# Patient Record
Sex: Male | Born: 1995 | Race: Black or African American | Hispanic: No | Marital: Married | Smoking: Current every day smoker
Health system: Southern US, Community
[De-identification: ages and names within clinical notes are randomized; demographics above are authoritative.]

## PROBLEM LIST (undated history)

## (undated) DIAGNOSIS — S6990XA Unspecified injury of unspecified wrist, hand and finger(s), initial encounter: Secondary | ICD-10-CM

## (undated) DIAGNOSIS — R413 Other amnesia: Principal | ICD-10-CM

## (undated) DIAGNOSIS — G4710 Hypersomnia, unspecified: Secondary | ICD-10-CM

## (undated) DIAGNOSIS — L708 Other acne: Secondary | ICD-10-CM

## (undated) DIAGNOSIS — E669 Obesity, unspecified: Secondary | ICD-10-CM

## (undated) DIAGNOSIS — S59919A Unspecified injury of unspecified forearm, initial encounter: Secondary | ICD-10-CM

## (undated) DIAGNOSIS — M25569 Pain in unspecified knee: Secondary | ICD-10-CM

## (undated) DIAGNOSIS — S59909A Unspecified injury of unspecified elbow, initial encounter: Secondary | ICD-10-CM

## (undated) DIAGNOSIS — M25579 Pain in unspecified ankle and joints of unspecified foot: Secondary | ICD-10-CM

## (undated) HISTORY — PX: MENISCUS REPAIR: SHX5179

## (undated) HISTORY — DX: Unspecified injury of unspecified wrist, hand and finger(s), initial encounter: S59.909A

## (undated) HISTORY — DX: Hypersomnia, unspecified: G47.10

## (undated) HISTORY — PX: HERNIA REPAIR: SHX51

## (undated) HISTORY — DX: Unspecified injury of unspecified elbow, initial encounter: S59.919A

## (undated) HISTORY — DX: Pain in unspecified ankle and joints of unspecified foot: M25.579

## (undated) HISTORY — DX: Other acne: L70.8

## (undated) HISTORY — DX: Obesity, unspecified: E66.9

## (undated) HISTORY — DX: Other amnesia: R41.3

## (undated) HISTORY — DX: Unspecified injury of unspecified wrist, hand and finger(s), initial encounter: S69.90XA

## (undated) HISTORY — DX: Pain in unspecified knee: M25.569

---
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
## 2014-01-13 ENCOUNTER — Ambulatory Visit: Payer: Medicaid Other | Admitting: Nurse Practitioner

## 2014-01-27 ENCOUNTER — Telehealth: Payer: Self-pay | Admitting: Nurse Practitioner

## 2014-01-27 ENCOUNTER — Ambulatory Visit: Payer: Medicaid Other | Admitting: Nurse Practitioner

## 2014-01-27 NOTE — Telephone Encounter (Signed)
Patient was no show for office appointment today. 

## 2014-02-03 ENCOUNTER — Ambulatory Visit (INDEPENDENT_AMBULATORY_CARE_PROVIDER_SITE_OTHER): Payer: Medicaid Other | Admitting: Nurse Practitioner

## 2014-02-03 ENCOUNTER — Encounter: Payer: Self-pay | Admitting: Nurse Practitioner

## 2014-02-03 VITALS — BP 113/72 | HR 76 | Ht 72.0 in | Wt 266.0 lb

## 2014-02-03 DIAGNOSIS — R413 Other amnesia: Secondary | ICD-10-CM

## 2014-02-03 DIAGNOSIS — R51 Headache: Secondary | ICD-10-CM

## 2014-02-03 DIAGNOSIS — M25539 Pain in unspecified wrist: Secondary | ICD-10-CM

## 2014-02-03 DIAGNOSIS — M25531 Pain in right wrist: Secondary | ICD-10-CM

## 2014-02-03 DIAGNOSIS — F341 Dysthymic disorder: Secondary | ICD-10-CM

## 2014-02-03 DIAGNOSIS — R519 Headache, unspecified: Secondary | ICD-10-CM

## 2014-02-03 NOTE — Patient Instructions (Addendum)
We will check your labwork today, to check on possible causes for memory problems.  Memory problems often come from symptoms of depression as well.  Try to get regular exercise and eat healthy foods.  If you continue to have wrist pain, please follow up with the orthopedic doctor to investigate possible causes or treatments.  Follow up in our office as needed.
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
## 2014-02-04 NOTE — Progress Notes (Signed)
I have read the note, and I agree with the clinical assessment and plan.  WILLIS,CHARLES KEITH   

## 2014-02-05 LAB — RPR: RPR: NONREACTIVE

## 2014-02-05 LAB — VITAMIN B12: Vitamin B-12: 523 pg/mL (ref 211–946)

## 2014-02-05 LAB — HEMOGLOBIN A1C
Est. average glucose Bld gHb Est-mCnc: 100 mg/dL
Hgb A1c MFr Bld: 5.1 % (ref 4.8–5.6)

## 2014-02-05 LAB — TSH: TSH: 1.49 u[IU]/mL (ref 0.450–4.500)

## 2014-02-05 LAB — HIV ANTIBODY (ROUTINE TESTING W REFLEX)
HIV 1/O/2 Abs-Index Value: <1 (ref <1.00)
HIV-1/HIV-2 Ab: NONREACTIVE

## 2014-02-07 NOTE — Progress Notes (Signed)
Quick Note:  Shared normal labs with patient, he verbalized understanding ______ 

## 2017-02-03 ENCOUNTER — Emergency Department (HOSPITAL_COMMUNITY)
Admission: EM | Admit: 2017-02-03 | Discharge: 2017-02-04 | Disposition: A | Discharge status: Home / Self Care | Payer: Self-pay | Attending: Emergency Medicine | Admitting: Emergency Medicine

## 2017-02-03 ENCOUNTER — Encounter (HOSPITAL_COMMUNITY): Payer: Self-pay | Admitting: Emergency Medicine

## 2017-02-03 ENCOUNTER — Emergency Department (HOSPITAL_COMMUNITY): Payer: Self-pay

## 2017-02-03 DIAGNOSIS — F17200 Nicotine dependence, unspecified, uncomplicated: Secondary | ICD-10-CM | POA: Insufficient documentation

## 2017-02-03 DIAGNOSIS — Z23 Encounter for immunization: Secondary | ICD-10-CM | POA: Insufficient documentation

## 2017-02-03 DIAGNOSIS — L0291 Cutaneous abscess, unspecified: Secondary | ICD-10-CM

## 2017-02-03 DIAGNOSIS — Z79899 Other long term (current) drug therapy: Secondary | ICD-10-CM | POA: Insufficient documentation

## 2017-02-03 DIAGNOSIS — L02215 Cutaneous abscess of perineum: Secondary | ICD-10-CM | POA: Insufficient documentation

## 2017-02-03 LAB — CBC WITH DIFFERENTIAL/PLATELET
Basophils Absolute: 0 10*3/uL (ref 0.0–0.1)
Basophils Relative: 0 %
Eosinophils Absolute: 0.2 10*3/uL (ref 0.0–0.7)
Eosinophils Relative: 2 %
HCT: 45.8 % (ref 39.0–52.0)
Hemoglobin: 15.4 g/dL (ref 13.0–17.0)
Lymphocytes Relative: 19 %
Lymphs Abs: 2.3 10*3/uL (ref 0.7–4.0)
MCH: 29.1 pg (ref 26.0–34.0)
MCHC: 33.6 g/dL (ref 30.0–36.0)
MCV: 86.6 fL (ref 78.0–100.0)
Monocytes Absolute: 1 10*3/uL (ref 0.1–1.0)
Monocytes Relative: 8 %
Neutro Abs: 8.6 10*3/uL — ABNORMAL HIGH (ref 1.7–7.7)
Neutrophils Relative %: 71 %
Platelets: 255 10*3/uL (ref 150–400)
RBC: 5.29 MIL/uL (ref 4.22–5.81)
RDW: 13.6 % (ref 11.5–15.5)
WBC: 12.2 10*3/uL — ABNORMAL HIGH (ref 4.0–10.5)

## 2017-02-03 LAB — BASIC METABOLIC PANEL
Anion gap: 7 (ref 5–15)
BUN: 8 mg/dL (ref 6–20)
CO2: 27 mmol/L (ref 22–32)
Calcium: 9.8 mg/dL (ref 8.9–10.3)
Chloride: 100 mmol/L — ABNORMAL LOW (ref 101–111)
Creatinine, Ser: 1.3 mg/dL — ABNORMAL HIGH (ref 0.61–1.24)
GFR calc Af Amer: >60 mL/min (ref >60)
GFR calc non Af Amer: >60 mL/min (ref >60)
Glucose, Bld: 91 mg/dL (ref 65–99)
Potassium: 4.5 mmol/L (ref 3.5–5.1)
Sodium: 134 mmol/L — ABNORMAL LOW (ref 135–145)

## 2017-02-03 LAB — URINALYSIS, ROUTINE W REFLEX MICROSCOPIC
Bacteria, UA: NONE SEEN
Bilirubin Urine: NEGATIVE
Glucose, UA: NEGATIVE mg/dL
Ketones, ur: NEGATIVE mg/dL
Leukocytes, UA: NEGATIVE
Nitrite: NEGATIVE
Protein, ur: NEGATIVE mg/dL
Specific Gravity, Urine: 1.014 (ref 1.005–1.030)
pH: 6 (ref 5.0–8.0)

## 2017-02-03 MED ORDER — IOPAMIDOL (ISOVUE-300) INJECTION 61%
INTRAVENOUS | Status: AC
  Filled 2017-02-03: qty 100

## 2017-02-03 MED ORDER — SODIUM CHLORIDE 0.9 % IV BOLUS (SEPSIS)
1000.0000 mL | Freq: Once | INTRAVENOUS | Status: AC
Start: 2017-02-03 — End: 2017-02-03
  Administered 2017-02-03: 1000 mL via INTRAVENOUS

## 2017-02-03 MED ORDER — IOPAMIDOL (ISOVUE-300) INJECTION 61%
100.0000 mL | Freq: Once | INTRAVENOUS | Status: AC | PRN
  Administered 2017-02-03: 100 mL via INTRAVENOUS

## 2017-02-03 MED ORDER — MORPHINE SULFATE (PF) 4 MG/ML IV SOLN
4.0000 mg | Freq: Once | INTRAVENOUS | Status: AC
  Administered 2017-02-03: 4 mg via INTRAVENOUS
  Filled 2017-02-03: qty 1

## 2017-02-03 MED ORDER — AMOXICILLIN-POT CLAVULANATE 875-125 MG PO TABS
1.0000 | ORAL_TABLET | Freq: Once | ORAL | Status: AC
  Administered 2017-02-04: 1 via ORAL
  Filled 2017-02-03: qty 1

## 2017-02-03 MED ORDER — ONDANSETRON HCL 4 MG/2ML IJ SOLN
4.0000 mg | Freq: Once | INTRAMUSCULAR | Status: AC
  Administered 2017-02-03: 4 mg via INTRAVENOUS
  Filled 2017-02-03: qty 2

## 2017-02-03 MED ORDER — TETANUS-DIPHTH-ACELL PERTUSSIS 5-2.5-18.5 LF-MCG/0.5 IM SUSP
0.5000 mL | Freq: Once | INTRAMUSCULAR | Status: AC
  Administered 2017-02-04: 0.5 mL via INTRAMUSCULAR
  Filled 2017-02-03: qty 0.5
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
## 2017-02-03 NOTE — ED Notes (Signed)
Pt has hx of abscesses.

## 2017-02-03 NOTE — ED Triage Notes (Signed)
Pt from home with complaints of abscess on his testicle. Pt states it has been present for about 3 days. Pt denies fever, chills, and denies drainage.

## 2017-02-04 MED ORDER — HYDROCODONE-ACETAMINOPHEN 5-325 MG PO TABS: 1.0000 | ORAL_TABLET | Freq: Four times a day (QID) | ORAL | 0 refills | Status: DC | PRN

## 2017-02-04 MED ORDER — HYDROCODONE-ACETAMINOPHEN 5-325 MG PO TABS
2.0000 | ORAL_TABLET | Freq: Once | ORAL | Status: AC
  Administered 2017-02-04: 2 via ORAL
  Filled 2017-02-04: qty 2

## 2017-02-04 MED ORDER — AMOXICILLIN-POT CLAVULANATE 875-125 MG PO TABS: 1.0000 | ORAL_TABLET | Freq: Two times a day (BID) | ORAL | 0 refills | Status: DC

## 2017-02-04 MED ORDER — IBUPROFEN 600 MG PO TABS: 600.0000 mg | ORAL_TABLET | Freq: Four times a day (QID) | ORAL | 0 refills | Status: DC | PRN

## 2017-02-04 MED ORDER — LIDOCAINE-EPINEPHRINE-TETRACAINE (LET) SOLUTION
3.0000 mL | Freq: Once | NASAL | Status: AC
  Administered 2017-02-04: 3 mL via TOPICAL
  Filled 2017-02-04: qty 3

## 2017-02-04 NOTE — Discharge Instructions (Signed)
Remove the bandage after 24 hours. You must wait at least 8 hours after the wound repair to wash the wound. Clean the wound and surrounding area gently with tap water and mild soap. Rinse well and blot dry. You may shower, but avoid submerging the wound, such as with a bath or swimming. Clean the wound daily to prevent infection. Do not use cleaners such as hydrogen peroxide or alcohol. You may use Tylenol, naproxen, or ibuprofen for pain. Vicodin for severe pain. Do not drive or perform other dangerous activities while taking the Vicodin.  Please take all of your antibiotics until finished!   You may develop abdominal discomfort or diarrhea from the antibiotic.  You may help offset this with probiotics which you can buy or get in yogurt. Do not eat or take the probiotics until 2 hours after your antibiotic.   Follow-up with a primary care provider or the urology specialists as soon as possible within the next week for wound check. For follow-up with the urology specialists, call the number provided to set up an appointment.  Return to the ED should you have a severe increase in pain, began to have a fever, the abscess or swelling recurs, or you have any other concerns.

## 2017-02-19 ENCOUNTER — Encounter (HOSPITAL_COMMUNITY): Payer: Self-pay | Admitting: Emergency Medicine

## 2017-02-19 ENCOUNTER — Emergency Department (HOSPITAL_COMMUNITY)
Admission: EM | Admit: 2017-02-19 | Discharge: 2017-02-19 | Disposition: A | Discharge status: Home / Self Care | Payer: Medicaid Other | Attending: Emergency Medicine | Admitting: Emergency Medicine

## 2017-02-19 DIAGNOSIS — L02416 Cutaneous abscess of left lower limb: Secondary | ICD-10-CM | POA: Insufficient documentation

## 2017-02-19 DIAGNOSIS — F17200 Nicotine dependence, unspecified, uncomplicated: Secondary | ICD-10-CM | POA: Insufficient documentation

## 2017-02-19 MED ORDER — DOXYCYCLINE HYCLATE 100 MG PO CAPS: 100.0000 mg | ORAL_CAPSULE | Freq: Two times a day (BID) | ORAL | 0 refills | Status: DC

## 2017-02-19 NOTE — Discharge Instructions (Signed)
Continue to apply warm moist compress to affected area several times daily to help with healing.  Take antibiotic as prescribed.  Return to the ER if you notice increasing pain and swelling despite treatment.
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
## 2017-02-19 NOTE — ED Triage Notes (Signed)
Pt states he developed an abscess on his left groin 2 days ago.

## 2017-09-15 ENCOUNTER — Emergency Department (HOSPITAL_COMMUNITY)
Admission: EM | Admit: 2017-09-15 | Discharge: 2017-09-15 | Disposition: A | Discharge status: Home / Self Care | Payer: Self-pay | Attending: Emergency Medicine | Admitting: Emergency Medicine

## 2017-09-15 ENCOUNTER — Encounter (HOSPITAL_COMMUNITY): Payer: Self-pay

## 2017-09-15 DIAGNOSIS — J029 Acute pharyngitis, unspecified: Secondary | ICD-10-CM | POA: Insufficient documentation

## 2017-09-15 DIAGNOSIS — R05 Cough: Secondary | ICD-10-CM | POA: Insufficient documentation

## 2017-09-15 DIAGNOSIS — F17200 Nicotine dependence, unspecified, uncomplicated: Secondary | ICD-10-CM | POA: Insufficient documentation

## 2017-09-15 LAB — RAPID STREP SCREEN (MED CTR MEBANE ONLY): Streptococcus, Group A Screen (Direct): NEGATIVE
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
## 2017-09-15 NOTE — Discharge Instructions (Signed)
Alternate between Tylenol and ibuprofen as needed for pain. Gargle warm salt water and spit it out. It is very important to stay hydrated!  Follow up with your primary care doctor in 5-7 days for recheck of ongoing symptoms and return to emergency department if any new or worsening of symptoms develop or you have any additional concerns.  °

## 2017-09-18 LAB — CULTURE, GROUP A STREP (THRC)

## 2017-10-29 ENCOUNTER — Emergency Department (HOSPITAL_COMMUNITY)
Admission: EM | Admit: 2017-10-29 | Discharge: 2017-10-29 | Disposition: A | Discharge status: Home / Self Care | Payer: Self-pay | Attending: Emergency Medicine | Admitting: Emergency Medicine

## 2017-10-29 ENCOUNTER — Other Ambulatory Visit: Payer: Self-pay

## 2017-10-29 ENCOUNTER — Encounter (HOSPITAL_COMMUNITY): Payer: Self-pay | Admitting: Emergency Medicine

## 2017-10-29 DIAGNOSIS — L0231 Cutaneous abscess of buttock: Secondary | ICD-10-CM | POA: Insufficient documentation

## 2017-10-29 DIAGNOSIS — F17210 Nicotine dependence, cigarettes, uncomplicated: Secondary | ICD-10-CM | POA: Insufficient documentation

## 2017-10-29 MED ORDER — OXYCODONE-ACETAMINOPHEN 5-325 MG PO TABS
1.0000 | ORAL_TABLET | Freq: Once | ORAL | Status: AC
  Administered 2017-10-29: 1 via ORAL
  Filled 2017-10-29: qty 1

## 2017-10-29 MED ORDER — ACETAMINOPHEN 325 MG PO TABS
650.0000 mg | ORAL_TABLET | Freq: Once | ORAL | Status: AC
  Administered 2017-10-29: 650 mg via ORAL
  Filled 2017-10-29: qty 2

## 2017-10-29 MED ORDER — LIDOCAINE-EPINEPHRINE (PF) 2 %-1:200000 IJ SOLN
20.0000 mL | Freq: Once | INTRAMUSCULAR | Status: AC
  Administered 2017-10-29: 20 mL
  Filled 2017-10-29: qty 20

## 2017-10-29 MED ORDER — DOXYCYCLINE HYCLATE 100 MG PO CAPS: 100.0000 mg | ORAL_CAPSULE | Freq: Two times a day (BID) | ORAL | 0 refills | Status: AC

## 2017-10-29 NOTE — Discharge Instructions (Signed)
Your abscess was drained today. Take antibiotics as prescribed. Continue doing warm compresses or use heating pad to the area. For pain take Tylenol and ibuprofen every 6-8 hours. You have packing in the abscess, this needs to be removed in the next 2-3 days. You should follow-up with a provider whether it's a primary care provider, urgent care or emergency Department for wound check and packing removal in 2-3 days. Return to the ED sooner if your symptoms worsen, you develop fever, chills, increased swelling, increased pain, pain with bowel movements.

## 2017-10-29 NOTE — ED Triage Notes (Signed)
Pt has abscess on his right butt cheek.  Has had it for about a week.  No other symptoms.
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
## 2017-12-18 ENCOUNTER — Other Ambulatory Visit: Payer: Self-pay

## 2017-12-18 ENCOUNTER — Emergency Department (HOSPITAL_COMMUNITY)
Admission: EM | Admit: 2017-12-18 | Discharge: 2017-12-18 | Disposition: A | Discharge status: Home / Self Care | Payer: Self-pay | Attending: Emergency Medicine | Admitting: Emergency Medicine

## 2017-12-18 ENCOUNTER — Encounter (HOSPITAL_COMMUNITY): Payer: Self-pay | Admitting: *Deleted

## 2017-12-18 DIAGNOSIS — F17200 Nicotine dependence, unspecified, uncomplicated: Secondary | ICD-10-CM | POA: Insufficient documentation

## 2017-12-18 DIAGNOSIS — Z23 Encounter for immunization: Secondary | ICD-10-CM | POA: Insufficient documentation

## 2017-12-18 DIAGNOSIS — L0291 Cutaneous abscess, unspecified: Secondary | ICD-10-CM

## 2017-12-18 DIAGNOSIS — L02811 Cutaneous abscess of head [any part, except face]: Secondary | ICD-10-CM | POA: Insufficient documentation

## 2017-12-18 MED ORDER — LIDOCAINE HCL (PF) 1 % IJ SOLN
5.0000 mL | Freq: Once | INTRAMUSCULAR | Status: AC
  Administered 2017-12-18: 5 mL
  Filled 2017-12-18: qty 5

## 2017-12-18 MED ORDER — NAPROXEN 500 MG PO TABS: 500.0000 mg | ORAL_TABLET | Freq: Two times a day (BID) | ORAL | 0 refills | Status: DC

## 2017-12-18 MED ORDER — TETANUS-DIPHTH-ACELL PERTUSSIS 5-2.5-18.5 LF-MCG/0.5 IM SUSP
0.5000 mL | Freq: Once | INTRAMUSCULAR | Status: AC
  Administered 2017-12-18: 0.5 mL via INTRAMUSCULAR
  Filled 2017-12-18: qty 0.5

## 2017-12-18 MED ORDER — ACETAMINOPHEN 325 MG PO TABS
650.0000 mg | ORAL_TABLET | Freq: Once | ORAL | Status: AC
  Administered 2017-12-18: 650 mg via ORAL
  Filled 2017-12-18: qty 2

## 2017-12-18 NOTE — ED Triage Notes (Signed)
Pt reports having a bump on right side of his head for several days, has been causing headaches. The bump began draining last night. Denies fever. No acute distress is noted at triage.

## 2017-12-18 NOTE — Discharge Instructions (Signed)
You were seen in the emergency department today for an abscess.  The abscess was opened and drained.  Please see the attached handout regarding aftercare instructions.  You will need to have this area rechecked in 2-3 days, you may return to the emergency department, go to an urgent care, or see a primary care provider.  You were given a prescription for naproxen to help with pain. Naproxen is a nonsteroidal anti-inflammatory medication that will help with pain and swelling. Be sure to take this medication as prescribed with food, 1 pill every 12 hours,  It should be taken with food, as it can cause stomach upset, and more seriously, stomach bleeding. Do not take other nonsteroidal anti-inflammatory medications with this such as Advil, Motrin, or Aleve.     Return to the emergency department sooner for any worsening symptoms including but not limited to fevers, vomiting, or any other concerns.
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
## 2018-02-25 ENCOUNTER — Other Ambulatory Visit: Payer: Self-pay

## 2018-02-25 ENCOUNTER — Emergency Department (HOSPITAL_COMMUNITY): Payer: Self-pay

## 2018-02-25 ENCOUNTER — Encounter (HOSPITAL_COMMUNITY): Payer: Self-pay | Admitting: Emergency Medicine

## 2018-02-25 ENCOUNTER — Emergency Department (HOSPITAL_COMMUNITY)
Admission: EM | Admit: 2018-02-25 | Discharge: 2018-02-25 | Disposition: A | Discharge status: Home / Self Care | Payer: Self-pay | Attending: Emergency Medicine | Admitting: Emergency Medicine

## 2018-02-25 DIAGNOSIS — Z79899 Other long term (current) drug therapy: Secondary | ICD-10-CM | POA: Insufficient documentation

## 2018-02-25 DIAGNOSIS — R1110 Vomiting, unspecified: Secondary | ICD-10-CM | POA: Insufficient documentation

## 2018-02-25 DIAGNOSIS — J029 Acute pharyngitis, unspecified: Secondary | ICD-10-CM | POA: Insufficient documentation

## 2018-02-25 DIAGNOSIS — R062 Wheezing: Secondary | ICD-10-CM | POA: Insufficient documentation

## 2018-02-25 DIAGNOSIS — F17210 Nicotine dependence, cigarettes, uncomplicated: Secondary | ICD-10-CM | POA: Insufficient documentation

## 2018-02-25 DIAGNOSIS — J209 Acute bronchitis, unspecified: Secondary | ICD-10-CM | POA: Insufficient documentation

## 2018-02-25 DIAGNOSIS — J9801 Acute bronchospasm: Secondary | ICD-10-CM

## 2018-02-25 MED ORDER — ALBUTEROL SULFATE HFA 108 (90 BASE) MCG/ACT IN AERS: 1.0000 | INHALATION_SPRAY | Freq: Four times a day (QID) | RESPIRATORY_TRACT | 0 refills | Status: DC | PRN

## 2018-02-25 MED ORDER — PREDNISONE 20 MG PO TABS
60.0000 mg | ORAL_TABLET | Freq: Once | ORAL | Status: AC
  Administered 2018-02-25: 60 mg via ORAL
  Filled 2018-02-25: qty 3

## 2018-02-25 MED ORDER — PROMETHAZINE-DM 6.25-15 MG/5ML PO SYRP: 5.0000 mL | ORAL_SOLUTION | Freq: Four times a day (QID) | ORAL | 0 refills | Status: DC | PRN

## 2018-02-25 MED ORDER — PREDNISONE 20 MG PO TABS: 20.0000 mg | ORAL_TABLET | Freq: Two times a day (BID) | ORAL | 0 refills | Status: DC

## 2018-02-25 MED ORDER — PROMETHAZINE-CODEINE 6.25-10 MG/5ML PO SYRP
5.0000 mL | ORAL_SOLUTION | Freq: Four times a day (QID) | ORAL | Status: DC | PRN
  Administered 2018-02-25: 5 mL via ORAL
  Filled 2018-02-25: qty 5

## 2018-02-25 MED ORDER — ALBUTEROL SULFATE (2.5 MG/3ML) 0.083% IN NEBU
2.5000 mg | INHALATION_SOLUTION | RESPIRATORY_TRACT | Status: DC | PRN
  Administered 2018-02-25: 2.5 mg via RESPIRATORY_TRACT
  Filled 2018-02-25: qty 3

## 2018-02-25 MED ORDER — BENZONATATE 100 MG PO CAPS: 100.0000 mg | ORAL_CAPSULE | Freq: Three times a day (TID) | ORAL | 0 refills | Status: DC

## 2018-02-25 MED ORDER — ONDANSETRON 4 MG PO TBDP
4.0000 mg | ORAL_TABLET | Freq: Once | ORAL | Status: AC
  Administered 2018-02-25: 4 mg via ORAL
  Filled 2018-02-25: qty 1

## 2018-02-25 NOTE — ED Triage Notes (Signed)
Pt complaint of cough for a few days; headache only with cough. Pt verbalizes emesis episode this morning and "maybe some blood in it."
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
## 2018-02-25 NOTE — ED Notes (Signed)
Patient transported to X-ray 

## 2018-02-25 NOTE — Discharge Instructions (Addendum)
Inhaler every 4 hours as needed.  Take prednisone with food to avoid stomach upset.  Tessalon for daytime cough.  Phenergan DM for nighttime cough.  Causes drowsiness.

## 2018-06-26 ENCOUNTER — Other Ambulatory Visit: Payer: Self-pay

## 2018-06-26 ENCOUNTER — Emergency Department (HOSPITAL_COMMUNITY)
Admission: EM | Admit: 2018-06-26 | Discharge: 2018-06-26 | Disposition: A | Discharge status: Home / Self Care | Payer: Self-pay | Attending: Emergency Medicine | Admitting: Emergency Medicine

## 2018-06-26 ENCOUNTER — Encounter (HOSPITAL_COMMUNITY): Payer: Self-pay

## 2018-06-26 DIAGNOSIS — L02416 Cutaneous abscess of left lower limb: Secondary | ICD-10-CM | POA: Insufficient documentation

## 2018-06-26 DIAGNOSIS — F17210 Nicotine dependence, cigarettes, uncomplicated: Secondary | ICD-10-CM | POA: Insufficient documentation

## 2018-06-26 MED ORDER — LIDOCAINE-EPINEPHRINE (PF) 2 %-1:200000 IJ SOLN
20.0000 mL | Freq: Once | INTRAMUSCULAR | Status: AC
  Administered 2018-06-26: 20 mL via INTRADERMAL
  Filled 2018-06-26: qty 20

## 2018-06-26 MED ORDER — DOXYCYCLINE HYCLATE 100 MG PO CAPS: 100.0000 mg | ORAL_CAPSULE | Freq: Two times a day (BID) | ORAL | 0 refills | Status: DC

## 2018-06-26 NOTE — ED Notes (Addendum)
Pt left ER without d/c paperwork or RN discharging pt. Pt left without antibiotic prescription. RN Waldo LaineKaitlin and RN Larita FifeMadina looked for pt without finding him. RN attempted to call pt on number listed.  Pt did not answer when RN attempted to call
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
## 2018-06-26 NOTE — Discharge Instructions (Signed)
You had an abscess which was drained in the ER today.  Please gently wash with warm soapy water starting tomorrow and keep covered with daily dressing changes.  Take antibiotic twice a day for the next ten days until all antibiotic is completely gone.  It will continue to ooze and leak for several days, this is normal.  You can take ibuprofen 600 mg every 6 hours as needed for pain.  Return to the ER if you have any new or concerning symptoms like worsening redness or swelling on your thigh, fever or vomiting that will not stop.

## 2018-06-26 NOTE — ED Notes (Signed)
Incision and drainage tray at bedside.  

## 2018-06-26 NOTE — ED Triage Notes (Signed)
Patient c/o left inner thigh abscess x 2-3 days.

## 2018-08-31 ENCOUNTER — Emergency Department (HOSPITAL_COMMUNITY)
Admission: EM | Admit: 2018-08-31 | Discharge: 2018-08-31 | Disposition: A | Discharge status: Home / Self Care | Payer: Self-pay | Attending: Emergency Medicine | Admitting: Emergency Medicine

## 2018-08-31 ENCOUNTER — Other Ambulatory Visit: Payer: Self-pay

## 2018-08-31 ENCOUNTER — Encounter (HOSPITAL_COMMUNITY): Payer: Self-pay | Admitting: *Deleted

## 2018-08-31 DIAGNOSIS — H209 Unspecified iridocyclitis: Secondary | ICD-10-CM | POA: Insufficient documentation

## 2018-08-31 DIAGNOSIS — W228XXA Striking against or struck by other objects, initial encounter: Secondary | ICD-10-CM | POA: Insufficient documentation

## 2018-08-31 DIAGNOSIS — S0502XA Injury of conjunctiva and corneal abrasion without foreign body, left eye, initial encounter: Secondary | ICD-10-CM | POA: Insufficient documentation

## 2018-08-31 DIAGNOSIS — F17200 Nicotine dependence, unspecified, uncomplicated: Secondary | ICD-10-CM | POA: Insufficient documentation

## 2018-08-31 DIAGNOSIS — Y990 Civilian activity done for income or pay: Secondary | ICD-10-CM | POA: Insufficient documentation

## 2018-08-31 DIAGNOSIS — Y9389 Activity, other specified: Secondary | ICD-10-CM | POA: Insufficient documentation

## 2018-08-31 DIAGNOSIS — Y9289 Other specified places as the place of occurrence of the external cause: Secondary | ICD-10-CM | POA: Insufficient documentation

## 2018-08-31 DIAGNOSIS — Z79899 Other long term (current) drug therapy: Secondary | ICD-10-CM | POA: Insufficient documentation

## 2018-08-31 MED ORDER — FLUORESCEIN SODIUM 1 MG OP STRP
1.0000 | ORAL_STRIP | Freq: Once | OPHTHALMIC | Status: AC
  Administered 2018-08-31: 1 via OPHTHALMIC
  Filled 2018-08-31: qty 1

## 2018-08-31 MED ORDER — ERYTHROMYCIN 5 MG/GM OP OINT: TOPICAL_OINTMENT | OPHTHALMIC | 0 refills | Status: DC

## 2018-08-31 MED ORDER — TETRACAINE HCL 0.5 % OP SOLN
2.0000 [drp] | Freq: Once | OPHTHALMIC | Status: AC
  Administered 2018-08-31: 2 [drp] via OPHTHALMIC
  Filled 2018-08-31: qty 4

## 2018-08-31 NOTE — Discharge Instructions (Signed)
Tylenol and motrin for pain.  Wear sunglasses.  Follow up with the eye doctor in the morning.
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
## 2018-08-31 NOTE — ED Notes (Signed)
All equipment at bedside.

## 2019-08-13 ENCOUNTER — Encounter (HOSPITAL_COMMUNITY): Payer: Self-pay | Admitting: *Deleted

## 2019-08-13 ENCOUNTER — Other Ambulatory Visit: Payer: Self-pay

## 2019-08-13 ENCOUNTER — Emergency Department (HOSPITAL_COMMUNITY): Payer: Self-pay

## 2019-08-13 DIAGNOSIS — R079 Chest pain, unspecified: Secondary | ICD-10-CM | POA: Insufficient documentation

## 2019-08-13 DIAGNOSIS — F17210 Nicotine dependence, cigarettes, uncomplicated: Secondary | ICD-10-CM | POA: Insufficient documentation

## 2019-08-13 DIAGNOSIS — Z79899 Other long term (current) drug therapy: Secondary | ICD-10-CM | POA: Insufficient documentation

## 2019-08-13 LAB — BASIC METABOLIC PANEL
Anion gap: 9 (ref 5–15)
BUN: 10 mg/dL (ref 6–20)
CO2: 25 mmol/L (ref 22–32)
Calcium: 9.6 mg/dL (ref 8.9–10.3)
Chloride: 103 mmol/L (ref 98–111)
Creatinine, Ser: 1.21 mg/dL (ref 0.61–1.24)
GFR calc Af Amer: >60 mL/min (ref >60)
GFR calc non Af Amer: >60 mL/min (ref >60)
Glucose, Bld: 87 mg/dL (ref 70–99)
Potassium: 3.9 mmol/L (ref 3.5–5.1)
Sodium: 137 mmol/L (ref 135–145)

## 2019-08-13 LAB — CBC
HCT: 46.5 % (ref 39.0–52.0)
Hemoglobin: 14.8 g/dL (ref 13.0–17.0)
MCH: 29.6 pg (ref 26.0–34.0)
MCHC: 31.8 g/dL (ref 30.0–36.0)
MCV: 93 fL (ref 80.0–100.0)
Platelets: 257 10*3/uL (ref 150–400)
RBC: 5 MIL/uL (ref 4.22–5.81)
RDW: 13.2 % (ref 11.5–15.5)
WBC: 8 10*3/uL (ref 4.0–10.5)
nRBC: 0 % (ref 0.0–0.2)

## 2019-08-13 LAB — TROPONIN I (HIGH SENSITIVITY): Troponin I (High Sensitivity): 2 ng/L (ref <18)

## 2019-08-13 MED ORDER — SODIUM CHLORIDE 0.9% FLUSH: 3.0000 mL | Freq: Once | INTRAVENOUS | Status: DC

## 2019-08-13 NOTE — ED Triage Notes (Signed)
Pt reporting upper left and lower right chest pain for several days. Mid back pain that is worse with inspiration. Denies cough or fevers.

## 2019-08-14 ENCOUNTER — Emergency Department (HOSPITAL_COMMUNITY)
Admission: EM | Admit: 2019-08-14 | Discharge: 2019-08-14 | Disposition: A | Discharge status: Home / Self Care | Payer: Self-pay | Attending: Emergency Medicine | Admitting: Emergency Medicine

## 2019-08-14 DIAGNOSIS — R079 Chest pain, unspecified: Secondary | ICD-10-CM

## 2019-08-14 LAB — TROPONIN I (HIGH SENSITIVITY): Troponin I (High Sensitivity): 3 ng/L (ref <18)

## 2019-08-14 MED ORDER — HYDROXYZINE HCL 25 MG PO TABS: 25.0000 mg | ORAL_TABLET | Freq: Every evening | ORAL | 0 refills | Status: DC | PRN
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
## 2019-08-14 NOTE — Discharge Instructions (Signed)
Your work-up in the emergency department today has been reassuring.  There is no evidence of any concerning cause of your chest discomfort.  Given that your symptoms are more prominent when you are not preoccupied, we suspect that your symptoms may be due to increased anxiety. You have been prescribed Atarax to take before bed to try and alleviate symptoms.  We advise follow-up with a primary care doctor.  You may return to the ED for any new or concerning symptoms.

## 2019-10-03 ENCOUNTER — Encounter (HOSPITAL_COMMUNITY): Payer: Self-pay

## 2019-10-03 ENCOUNTER — Emergency Department (HOSPITAL_COMMUNITY): Payer: Self-pay

## 2019-10-03 ENCOUNTER — Other Ambulatory Visit: Payer: Self-pay

## 2019-10-03 ENCOUNTER — Emergency Department (HOSPITAL_COMMUNITY)
Admission: EM | Admit: 2019-10-03 | Discharge: 2019-10-03 | Disposition: A | Discharge status: Home / Self Care | Payer: Self-pay | Attending: Emergency Medicine | Admitting: Emergency Medicine

## 2019-10-03 DIAGNOSIS — F17200 Nicotine dependence, unspecified, uncomplicated: Secondary | ICD-10-CM | POA: Insufficient documentation

## 2019-10-03 DIAGNOSIS — R0789 Other chest pain: Secondary | ICD-10-CM | POA: Insufficient documentation

## 2019-10-03 DIAGNOSIS — R071 Chest pain on breathing: Secondary | ICD-10-CM | POA: Insufficient documentation

## 2019-10-03 LAB — BASIC METABOLIC PANEL
Anion gap: 7 (ref 5–15)
BUN: 14 mg/dL (ref 6–20)
CO2: 25 mmol/L (ref 22–32)
Calcium: 9.7 mg/dL (ref 8.9–10.3)
Chloride: 105 mmol/L (ref 98–111)
Creatinine, Ser: 1.2 mg/dL (ref 0.61–1.24)
GFR calc Af Amer: >60 mL/min (ref >60)
GFR calc non Af Amer: >60 mL/min (ref >60)
Glucose, Bld: 95 mg/dL (ref 70–99)
Potassium: 4.4 mmol/L (ref 3.5–5.1)
Sodium: 137 mmol/L (ref 135–145)

## 2019-10-03 LAB — LIPASE, BLOOD: Lipase: 34 U/L (ref 11–51)

## 2019-10-03 LAB — HEPATIC FUNCTION PANEL
ALT: 37 U/L (ref 0–44)
ALT: 39 U/L (ref 0–44)
AST: 24 U/L (ref 15–41)
AST: 25 U/L (ref 15–41)
Albumin: 4.3 g/dL (ref 3.5–5.0)
Albumin: 4.3 g/dL (ref 3.5–5.0)
Alkaline Phosphatase: 44 U/L (ref 38–126)
Alkaline Phosphatase: 45 U/L (ref 38–126)
Bilirubin, Direct: 0.1 mg/dL (ref 0.0–0.2)
Bilirubin, Direct: 0.1 mg/dL (ref 0.0–0.2)
Indirect Bilirubin: 0.6 mg/dL (ref 0.3–0.9)
Indirect Bilirubin: 0.7 mg/dL (ref 0.3–0.9)
Total Bilirubin: 0.7 mg/dL (ref 0.3–1.2)
Total Bilirubin: 0.8 mg/dL (ref 0.3–1.2)
Total Protein: 7.8 g/dL (ref 6.5–8.1)
Total Protein: 7.8 g/dL (ref 6.5–8.1)

## 2019-10-03 LAB — CBC
HCT: 48.4 % (ref 39.0–52.0)
Hemoglobin: 15.2 g/dL (ref 13.0–17.0)
MCH: 29.5 pg (ref 26.0–34.0)
MCHC: 31.4 g/dL (ref 30.0–36.0)
MCV: 93.8 fL (ref 80.0–100.0)
Platelets: 249 10*3/uL (ref 150–400)
RBC: 5.16 MIL/uL (ref 4.22–5.81)
RDW: 13.1 % (ref 11.5–15.5)
WBC: 6.2 10*3/uL (ref 4.0–10.5)
nRBC: 0 % (ref 0.0–0.2)

## 2019-10-03 LAB — D-DIMER, QUANTITATIVE: D-Dimer, Quant: 0.32 ug/mL-FEU (ref 0.00–0.50)

## 2019-10-03 LAB — TROPONIN I (HIGH SENSITIVITY)
Troponin I (High Sensitivity): <2 ng/L (ref <18)
Troponin I (High Sensitivity): 2 ng/L (ref <18)

## 2019-10-03 MED ORDER — SODIUM CHLORIDE 0.9% FLUSH: 3.0000 mL | Freq: Once | INTRAVENOUS | Status: DC

## 2019-10-03 MED ORDER — CYCLOBENZAPRINE HCL 10 MG PO TABS: 10.0000 mg | ORAL_TABLET | Freq: Two times a day (BID) | ORAL | 0 refills | Status: DC | PRN
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
## 2019-10-03 NOTE — Discharge Instructions (Signed)
Your work-up today was overall reassuring.  We suspect your symptoms are likely musculoskeletal in nature given the characterization and your reassuring work-up otherwise.  We offered Covid testing however he did not want to pursue that today.  Please rest and stay hydrated.  Please use a muscle relaxant to help with the muscle pains.  If any symptoms change or worsen, please return to nearest emergency department.

## 2019-10-03 NOTE — ED Triage Notes (Signed)
Patient c/o constant pain under right rib and intermittent SOB since last night.

## 2020-04-10 ENCOUNTER — Encounter (HOSPITAL_COMMUNITY): Payer: Self-pay

## 2020-04-10 ENCOUNTER — Other Ambulatory Visit: Payer: Self-pay

## 2020-04-10 ENCOUNTER — Emergency Department (HOSPITAL_COMMUNITY)
Admission: EM | Admit: 2020-04-10 | Discharge: 2020-04-10 | Disposition: A | Discharge status: Home / Self Care | Payer: Self-pay | Attending: Emergency Medicine | Admitting: Emergency Medicine

## 2020-04-10 DIAGNOSIS — G8929 Other chronic pain: Secondary | ICD-10-CM

## 2020-04-10 DIAGNOSIS — G8921 Chronic pain due to trauma: Secondary | ICD-10-CM | POA: Insufficient documentation

## 2020-04-10 DIAGNOSIS — F17210 Nicotine dependence, cigarettes, uncomplicated: Secondary | ICD-10-CM | POA: Insufficient documentation

## 2020-04-10 DIAGNOSIS — M25512 Pain in left shoulder: Secondary | ICD-10-CM | POA: Insufficient documentation

## 2020-04-10 MED ORDER — IBUPROFEN 600 MG PO TABS: 600.0000 mg | ORAL_TABLET | Freq: Four times a day (QID) | ORAL | 0 refills | Status: DC | PRN

## 2020-04-10 MED ORDER — LIDOCAINE 5 % EX PTCH: 1.0000 | MEDICATED_PATCH | CUTANEOUS | 0 refills | Status: DC

## 2020-04-10 MED ORDER — PREDNISONE 10 MG (21) PO TBPK: ORAL_TABLET | ORAL | 0 refills | Status: DC

## 2020-04-10 MED ORDER — METHOCARBAMOL 750 MG PO TABS: 750.0000 mg | ORAL_TABLET | Freq: Two times a day (BID) | ORAL | 0 refills | Status: DC | PRN

## 2020-04-10 NOTE — ED Notes (Signed)
An After Visit Summary was printed and given to the patient. Discharge instructions given and no further questions at this time.  

## 2020-04-10 NOTE — Discharge Instructions (Signed)
  Antiinflammatory medications: Take 600 mg of ibuprofen every 6 hours or 440 mg (over the counter dose) to 500 mg (prescription dose) of naproxen every 12 hours for the next 3 days. After this time, these medications may be used as needed for pain. Take these medications with food to avoid upset stomach. Choose only one of these medications, do not take them together. Acetaminophen (generic for Tylenol): Should you continue to have additional pain while taking the ibuprofen or naproxen, you may add in acetaminophen as needed. Your daily total maximum amount of acetaminophen from all sources should be limited to 4000mg /day for persons without liver problems, or 2000mg /day for those with liver problems. Methocarbamol: Methocarbamol (generic for Robaxin) is a muscle relaxer and can help relieve stiff muscles or muscle spasms.  Do not drive or perform other dangerous activities while taking this medication as it can cause drowsiness as well as changes in reaction time and judgement. Lidocaine patches: These are available via either prescription or over-the-counter. The over-the-counter option may be more economical one and are likely just as effective. There are multiple over-the-counter brands, such as Salonpas. Ice: May apply ice to the area over the next 24 hours for 15 minutes at a time to reduce pain, inflammation, and swelling, if present. Exercises: Be sure to perform the attached exercises starting with three times a week and working up to performing them daily. This is an essential part of preventing long term problems.  Follow up: Follow up with a primary care provider or orthopedic specialist for any future management of these complaints.  Ideally, follow-up within the next couple weeks. Return: Return to the ED should symptoms worsen.  For prescription assistance, may try using prescription discount sites or apps, such as goodrx.com
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
## 2020-07-19 ENCOUNTER — Encounter (HOSPITAL_COMMUNITY): Payer: Self-pay | Admitting: *Deleted

## 2020-07-19 ENCOUNTER — Emergency Department (HOSPITAL_COMMUNITY)
Admission: EM | Admit: 2020-07-19 | Discharge: 2020-07-19 | Disposition: A | Discharge status: Home / Self Care | Payer: Self-pay | Attending: Emergency Medicine | Admitting: Emergency Medicine

## 2020-07-19 ENCOUNTER — Other Ambulatory Visit: Payer: Self-pay

## 2020-07-19 DIAGNOSIS — Z79899 Other long term (current) drug therapy: Secondary | ICD-10-CM | POA: Insufficient documentation

## 2020-07-19 DIAGNOSIS — F17200 Nicotine dependence, unspecified, uncomplicated: Secondary | ICD-10-CM | POA: Insufficient documentation

## 2020-07-19 DIAGNOSIS — M545 Low back pain, unspecified: Secondary | ICD-10-CM

## 2020-07-19 MED ORDER — CYCLOBENZAPRINE HCL 10 MG PO TABS: 10.0000 mg | ORAL_TABLET | Freq: Two times a day (BID) | ORAL | 0 refills | Status: DC | PRN
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
## 2020-07-19 NOTE — ED Notes (Signed)
Pt reports soreness in low back with hip flexion. Pt states pain is particularly worse in the morning. He denies any bowel or bladder problems at this time. Pt denies any tenderness upon palpation of his lumbar spine

## 2020-07-19 NOTE — ED Triage Notes (Signed)
Ambulatory to triage with steady gait. Pt states lower back pain for about a week, not improved by OTC meds.

## 2020-07-19 NOTE — Discharge Instructions (Addendum)
Please read instructions below.  °You can take 600 mg of ibuprofen every 6 hours as needed for pain.   °Apply ice to your back for 20 minutes at a time.  You can also apply heat if this provides more relief.   °You can take Flexeril/cyclobenzaprine every 12 hours as needed for muscle spasm.  Be aware this medication can make you drowsy; do not take while driving or drinking alcohol.   °Follow-up with your primary care provider symptoms persist.   °Return to ER if new numbness or tingling in your arms or legs, inability to urinate, inability to hold your bowels, or weakness in your extremities.  ° °

## 2020-08-08 ENCOUNTER — Encounter (HOSPITAL_COMMUNITY): Payer: Self-pay | Admitting: *Deleted

## 2020-08-08 ENCOUNTER — Other Ambulatory Visit: Payer: Self-pay

## 2020-08-08 ENCOUNTER — Emergency Department (HOSPITAL_COMMUNITY)
Admission: EM | Admit: 2020-08-08 | Discharge: 2020-08-08 | Disposition: A | Discharge status: Home / Self Care | Payer: Self-pay | Attending: Emergency Medicine | Admitting: Emergency Medicine

## 2020-08-08 ENCOUNTER — Emergency Department (HOSPITAL_COMMUNITY): Payer: Self-pay

## 2020-08-08 DIAGNOSIS — F17200 Nicotine dependence, unspecified, uncomplicated: Secondary | ICD-10-CM | POA: Insufficient documentation

## 2020-08-08 DIAGNOSIS — Z20822 Contact with and (suspected) exposure to covid-19: Secondary | ICD-10-CM | POA: Insufficient documentation

## 2020-08-08 DIAGNOSIS — J069 Acute upper respiratory infection, unspecified: Secondary | ICD-10-CM | POA: Insufficient documentation

## 2020-08-08 LAB — SARS CORONAVIRUS 2 BY RT PCR (HOSPITAL ORDER, PERFORMED IN ~~LOC~~ HOSPITAL LAB): SARS Coronavirus 2: NEGATIVE

## 2020-08-08 MED ORDER — BENZONATATE 100 MG PO CAPS: 100.0000 mg | ORAL_CAPSULE | Freq: Three times a day (TID) | ORAL | 0 refills | Status: DC | PRN

## 2020-08-08 MED ORDER — ALBUTEROL SULFATE HFA 108 (90 BASE) MCG/ACT IN AERS
6.0000 | INHALATION_SPRAY | Freq: Once | RESPIRATORY_TRACT | Status: AC
  Administered 2020-08-08: 6 via RESPIRATORY_TRACT
  Filled 2020-08-08: qty 6.7

## 2020-08-08 NOTE — Discharge Instructions (Addendum)
Your Covid test today was negative.  You likely have a viral illness.  A prescription for cough medicine was sent to your pharmacy.  Take as prescribed if needed.   You should also continue over-the-counter treatment for your symptoms.
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
## 2020-08-08 NOTE — ED Notes (Signed)
Pt ambulated in room with pulse ox.  Pt O2 remained at 97% RA.

## 2020-08-08 NOTE — ED Triage Notes (Signed)
Pt reports cough, stuffy nose, and loss of smell since Sunday.

## 2020-11-01 ENCOUNTER — Emergency Department (HOSPITAL_COMMUNITY): Payer: Self-pay

## 2020-11-01 ENCOUNTER — Emergency Department (HOSPITAL_COMMUNITY)
Admission: EM | Admit: 2020-11-01 | Discharge: 2020-11-01 | Disposition: A | Discharge status: Home / Self Care | Payer: Self-pay | Attending: Emergency Medicine | Admitting: Emergency Medicine

## 2020-11-01 ENCOUNTER — Other Ambulatory Visit: Payer: Self-pay

## 2020-11-01 ENCOUNTER — Encounter (HOSPITAL_COMMUNITY): Payer: Self-pay

## 2020-11-01 DIAGNOSIS — Z20822 Contact with and (suspected) exposure to covid-19: Secondary | ICD-10-CM

## 2020-11-01 DIAGNOSIS — Z720 Tobacco use: Secondary | ICD-10-CM

## 2020-11-01 DIAGNOSIS — F17200 Nicotine dependence, unspecified, uncomplicated: Secondary | ICD-10-CM | POA: Insufficient documentation

## 2020-11-01 DIAGNOSIS — J209 Acute bronchitis, unspecified: Secondary | ICD-10-CM

## 2020-11-01 LAB — RESP PANEL BY RT-PCR (FLU A&B, COVID) ARPGX2
Influenza A by PCR: NEGATIVE
Influenza B by PCR: NEGATIVE
SARS Coronavirus 2 by RT PCR: NEGATIVE

## 2020-11-01 MED ORDER — DOXYCYCLINE HYCLATE 100 MG PO CAPS: 100.0000 mg | ORAL_CAPSULE | Freq: Two times a day (BID) | ORAL | 0 refills | Status: DC

## 2020-11-01 MED ORDER — AEROCHAMBER PLUS FLO-VU MEDIUM MISC
1.0000 | Freq: Once | Status: AC
  Administered 2020-11-01: 10:00:00 1
  Filled 2020-11-01 (×2): qty 1

## 2020-11-01 MED ORDER — ALBUTEROL SULFATE HFA 108 (90 BASE) MCG/ACT IN AERS
2.0000 | INHALATION_SPRAY | Freq: Once | RESPIRATORY_TRACT | Status: AC
  Administered 2020-11-01: 10:00:00 2 via RESPIRATORY_TRACT
  Filled 2020-11-01: qty 6.7

## 2020-11-01 MED ORDER — DOXYCYCLINE HYCLATE 100 MG PO TABS
100.0000 mg | ORAL_TABLET | Freq: Once | ORAL | Status: AC
  Administered 2020-11-01: 11:00:00 100 mg via ORAL
  Filled 2020-11-01: qty 1

## 2020-11-01 MED ORDER — GUAIFENESIN 100 MG/5ML PO SYRP: 100.0000 mg | ORAL_SOLUTION | ORAL | 0 refills | Status: DC | PRN
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
## 2020-11-01 NOTE — Discharge Instructions (Addendum)
Try to stop smoking.    Your Covid test will be back in a few hrs.  Check my chart for results.

## 2020-11-01 NOTE — ED Triage Notes (Signed)
Pt arrived via walk in, c/o productive cough and SOB x2 weeks. Denies any chest pain. Denies any sick contacts or any other sx.

## 2020-11-19 ENCOUNTER — Emergency Department (HOSPITAL_COMMUNITY)
Admission: EM | Admit: 2020-11-19 | Discharge: 2020-11-19 | Disposition: A | Discharge status: Home / Self Care | Payer: HRSA Program | Attending: Emergency Medicine | Admitting: Emergency Medicine

## 2020-11-19 ENCOUNTER — Other Ambulatory Visit: Payer: Self-pay

## 2020-11-19 ENCOUNTER — Encounter (HOSPITAL_COMMUNITY): Payer: Self-pay | Admitting: *Deleted

## 2020-11-19 DIAGNOSIS — Z5321 Procedure and treatment not carried out due to patient leaving prior to being seen by health care provider: Secondary | ICD-10-CM | POA: Insufficient documentation

## 2020-11-19 DIAGNOSIS — Z20822 Contact with and (suspected) exposure to covid-19: Secondary | ICD-10-CM | POA: Diagnosis present

## 2020-11-19 DIAGNOSIS — U071 COVID-19: Secondary | ICD-10-CM | POA: Insufficient documentation
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
## 2020-12-10 ENCOUNTER — Other Ambulatory Visit: Payer: Self-pay

## 2020-12-10 ENCOUNTER — Encounter (HOSPITAL_COMMUNITY): Payer: Self-pay

## 2020-12-10 ENCOUNTER — Emergency Department (HOSPITAL_COMMUNITY)
Admission: EM | Admit: 2020-12-10 | Discharge: 2020-12-10 | Disposition: A | Discharge status: Home / Self Care | Payer: Self-pay | Attending: Emergency Medicine | Admitting: Emergency Medicine

## 2020-12-10 DIAGNOSIS — F17210 Nicotine dependence, cigarettes, uncomplicated: Secondary | ICD-10-CM | POA: Insufficient documentation

## 2020-12-10 DIAGNOSIS — L02411 Cutaneous abscess of right axilla: Secondary | ICD-10-CM | POA: Insufficient documentation

## 2020-12-10 MED ORDER — LIDOCAINE-EPINEPHRINE (PF) 2 %-1:200000 IJ SOLN
10.0000 mL | Freq: Once | INTRAMUSCULAR | Status: AC
  Administered 2020-12-10: 10 mL
  Filled 2020-12-10: qty 20

## 2020-12-10 MED ORDER — CEPHALEXIN 500 MG PO CAPS: 500.0000 mg | ORAL_CAPSULE | Freq: Two times a day (BID) | ORAL | 0 refills | Status: AC

## 2020-12-10 NOTE — ED Triage Notes (Signed)
Patient c/o right axillary abscess x 1 week.

## 2020-12-10 NOTE — Discharge Instructions (Signed)
Follow-up in 2 days for wound recheck  Warm compress to area  Take antibiotics as prescribed
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
## 2021-07-22 ENCOUNTER — Encounter (HOSPITAL_COMMUNITY): Payer: Self-pay

## 2021-07-22 ENCOUNTER — Emergency Department (HOSPITAL_COMMUNITY): Payer: Self-pay

## 2021-07-22 ENCOUNTER — Other Ambulatory Visit: Payer: Self-pay

## 2021-07-22 ENCOUNTER — Emergency Department (HOSPITAL_COMMUNITY)
Admission: EM | Admit: 2021-07-22 | Discharge: 2021-07-22 | Disposition: A | Discharge status: Home / Self Care | Payer: Self-pay | Attending: Emergency Medicine | Admitting: Emergency Medicine

## 2021-07-22 DIAGNOSIS — N492 Inflammatory disorders of scrotum: Secondary | ICD-10-CM | POA: Insufficient documentation

## 2021-07-22 DIAGNOSIS — F17210 Nicotine dependence, cigarettes, uncomplicated: Secondary | ICD-10-CM | POA: Insufficient documentation

## 2021-07-22 DIAGNOSIS — R000 Tachycardia, unspecified: Secondary | ICD-10-CM | POA: Insufficient documentation

## 2021-07-22 LAB — BASIC METABOLIC PANEL
Anion gap: 6 (ref 5–15)
BUN: 9 mg/dL (ref 6–20)
CO2: 26 mmol/L (ref 22–32)
Calcium: 9.3 mg/dL (ref 8.9–10.3)
Chloride: 104 mmol/L (ref 98–111)
Creatinine, Ser: 1.16 mg/dL (ref 0.61–1.24)
GFR, Estimated: >60 mL/min (ref >60)
Glucose, Bld: 95 mg/dL (ref 70–99)
Potassium: 3.9 mmol/L (ref 3.5–5.1)
Sodium: 136 mmol/L (ref 135–145)

## 2021-07-22 LAB — CBC WITH DIFFERENTIAL/PLATELET
Abs Immature Granulocytes: 0.02 10*3/uL (ref 0.00–0.07)
Basophils Absolute: 0.1 10*3/uL (ref 0.0–0.1)
Basophils Relative: 1 %
Eosinophils Absolute: 0.2 10*3/uL (ref 0.0–0.5)
Eosinophils Relative: 2 %
HCT: 46.6 % (ref 39.0–52.0)
Hemoglobin: 15 g/dL (ref 13.0–17.0)
Immature Granulocytes: 0 %
Lymphocytes Relative: 17 %
Lymphs Abs: 1.6 10*3/uL (ref 0.7–4.0)
MCH: 29.8 pg (ref 26.0–34.0)
MCHC: 32.2 g/dL (ref 30.0–36.0)
MCV: 92.5 fL (ref 80.0–100.0)
Monocytes Absolute: 0.8 10*3/uL (ref 0.1–1.0)
Monocytes Relative: 8 %
Neutro Abs: 7.2 10*3/uL (ref 1.7–7.7)
Neutrophils Relative %: 72 %
Platelets: 281 10*3/uL (ref 150–400)
RBC: 5.04 MIL/uL (ref 4.22–5.81)
RDW: 13.5 % (ref 11.5–15.5)
WBC: 9.8 10*3/uL (ref 4.0–10.5)
nRBC: 0 % (ref 0.0–0.2)

## 2021-07-22 LAB — URINALYSIS, ROUTINE W REFLEX MICROSCOPIC
Bilirubin Urine: NEGATIVE
Glucose, UA: NEGATIVE mg/dL
Ketones, ur: NEGATIVE mg/dL
Leukocytes,Ua: NEGATIVE
Nitrite: NEGATIVE
Protein, ur: NEGATIVE mg/dL
Specific Gravity, Urine: 1.025 (ref 1.005–1.030)
pH: 6 (ref 5.0–8.0)

## 2021-07-22 MED ORDER — OXYCODONE-ACETAMINOPHEN 5-325 MG PO TABS
1.0000 | ORAL_TABLET | Freq: Once | ORAL | Status: AC
  Administered 2021-07-22: 1 via ORAL
  Filled 2021-07-22: qty 1

## 2021-07-22 MED ORDER — AMOXICILLIN-POT CLAVULANATE 875-125 MG PO TABS: 1.0000 | ORAL_TABLET | Freq: Two times a day (BID) | ORAL | 0 refills | Status: AC

## 2021-07-22 MED ORDER — ONDANSETRON HCL 4 MG/2ML IJ SOLN
4.0000 mg | Freq: Once | INTRAMUSCULAR | Status: AC
  Administered 2021-07-22: 4 mg via INTRAVENOUS
  Filled 2021-07-22: qty 2

## 2021-07-22 MED ORDER — SODIUM CHLORIDE 0.9 % IV BOLUS
1000.0000 mL | Freq: Once | INTRAVENOUS | Status: AC
  Administered 2021-07-22: 1000 mL via INTRAVENOUS

## 2021-07-22 MED ORDER — HYDROMORPHONE HCL 1 MG/ML IJ SOLN
1.0000 mg | Freq: Once | INTRAMUSCULAR | Status: AC
  Administered 2021-07-22: 1 mg via INTRAVENOUS
  Filled 2021-07-22: qty 1

## 2021-07-22 MED ORDER — MORPHINE SULFATE (PF) 4 MG/ML IV SOLN
4.0000 mg | Freq: Once | INTRAVENOUS | Status: AC
  Administered 2021-07-22: 4 mg via INTRAVENOUS
  Filled 2021-07-22: qty 1

## 2021-07-22 MED ORDER — LIDOCAINE-EPINEPHRINE 2 %-1:100000 IJ SOLN: 20.0000 mL | Freq: Once | INTRAMUSCULAR | Status: DC

## 2021-07-22 MED ORDER — OXYCODONE-ACETAMINOPHEN 5-325 MG PO TABS: 1.0000 | ORAL_TABLET | Freq: Four times a day (QID) | ORAL | 0 refills | Status: DC | PRN

## 2021-07-22 MED ORDER — LIDOCAINE-EPINEPHRINE-TETRACAINE (LET) TOPICAL GEL: 3.0000 mL | Freq: Once | TOPICAL | Status: DC

## 2021-07-22 MED ORDER — LIDOCAINE HCL 2 % IJ SOLN
15.0000 mL | Freq: Once | INTRAMUSCULAR | Status: AC
  Administered 2021-07-22: 300 mg via INTRADERMAL
  Filled 2021-07-22: qty 20

## 2021-07-22 NOTE — ED Provider Notes (Signed)
I provided a substantive portion of the care of this patient.  I personally performed the entirety of the medical decision making for this encounter.      25 year old male presents with right-sided scrotal swelling x4 days.  No fever or chills.  Has evidence of scrotal abscess on ultrasound.  Case discussed with urology recommends drainage and follow-up in the office.   Lorre Nick, MD 07/22/21 [phone]

## 2021-07-22 NOTE — ED Triage Notes (Signed)
Patient c/o right scrotal pain and swelling x 4 days ago.
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
## 2021-07-22 NOTE — Discharge Instructions (Addendum)
You were given a prescription for antibiotics. Please take the antibiotic prescription fully.   Follow up with the urologist in 3-5 days for reassessment.   Please return to the emergency department for any new or worsening symptoms.

## 2021-07-23 ENCOUNTER — Encounter (HOSPITAL_COMMUNITY): Payer: Self-pay | Admitting: Emergency Medicine

## 2021-07-23 ENCOUNTER — Other Ambulatory Visit: Payer: Self-pay

## 2021-07-23 ENCOUNTER — Emergency Department (HOSPITAL_COMMUNITY): Payer: Self-pay

## 2021-07-23 ENCOUNTER — Emergency Department (HOSPITAL_COMMUNITY)
Admission: EM | Admit: 2021-07-23 | Discharge: 2021-07-23 | Disposition: A | Discharge status: Home / Self Care | Payer: Self-pay | Attending: Emergency Medicine | Admitting: Emergency Medicine

## 2021-07-23 DIAGNOSIS — N5082 Scrotal pain: Secondary | ICD-10-CM

## 2021-07-23 DIAGNOSIS — N50811 Right testicular pain: Secondary | ICD-10-CM | POA: Insufficient documentation

## 2021-07-23 DIAGNOSIS — F17210 Nicotine dependence, cigarettes, uncomplicated: Secondary | ICD-10-CM | POA: Insufficient documentation

## 2021-07-23 LAB — COMPREHENSIVE METABOLIC PANEL
ALT: 18 U/L (ref 0–44)
AST: 19 U/L (ref 15–41)
Albumin: 4.1 g/dL (ref 3.5–5.0)
Alkaline Phosphatase: 51 U/L (ref 38–126)
Anion gap: 6 (ref 5–15)
BUN: 11 mg/dL (ref 6–20)
CO2: 26 mmol/L (ref 22–32)
Calcium: 9.6 mg/dL (ref 8.9–10.3)
Chloride: 109 mmol/L (ref 98–111)
Creatinine, Ser: 1.24 mg/dL (ref 0.61–1.24)
GFR, Estimated: >60 mL/min (ref >60)
Glucose, Bld: 103 mg/dL — ABNORMAL HIGH (ref 70–99)
Potassium: 4 mmol/L (ref 3.5–5.1)
Sodium: 141 mmol/L (ref 135–145)
Total Bilirubin: 0.8 mg/dL (ref 0.3–1.2)
Total Protein: 8.5 g/dL — ABNORMAL HIGH (ref 6.5–8.1)

## 2021-07-23 LAB — CBC WITH DIFFERENTIAL/PLATELET
Abs Immature Granulocytes: 0.06 10*3/uL (ref 0.00–0.07)
Basophils Absolute: 0.1 10*3/uL (ref 0.0–0.1)
Basophils Relative: 0 %
Eosinophils Absolute: 0.2 10*3/uL (ref 0.0–0.5)
Eosinophils Relative: 1 %
HCT: 44 % (ref 39.0–52.0)
Hemoglobin: 13.9 g/dL (ref 13.0–17.0)
Immature Granulocytes: 0 %
Lymphocytes Relative: 15 %
Lymphs Abs: 2.2 10*3/uL (ref 0.7–4.0)
MCH: 29.4 pg (ref 26.0–34.0)
MCHC: 31.6 g/dL (ref 30.0–36.0)
MCV: 93 fL (ref 80.0–100.0)
Monocytes Absolute: 1.3 10*3/uL — ABNORMAL HIGH (ref 0.1–1.0)
Monocytes Relative: 9 %
Neutro Abs: 11 10*3/uL — ABNORMAL HIGH (ref 1.7–7.7)
Neutrophils Relative %: 75 %
Platelets: 272 10*3/uL (ref 150–400)
RBC: 4.73 MIL/uL (ref 4.22–5.81)
RDW: 13.3 % (ref 11.5–15.5)
WBC: 14.8 10*3/uL — ABNORMAL HIGH (ref 4.0–10.5)
nRBC: 0 % (ref 0.0–0.2)

## 2021-07-23 LAB — LACTIC ACID, PLASMA: Lactic Acid, Venous: 0.8 mmol/L (ref 0.5–1.9)

## 2021-07-23 LAB — URINALYSIS, MICROSCOPIC (REFLEX)
Bacteria, UA: NONE SEEN
RBC / HPF: >50 RBC/hpf (ref 0–5)

## 2021-07-23 LAB — URINALYSIS, ROUTINE W REFLEX MICROSCOPIC
Glucose, UA: NEGATIVE mg/dL
Leukocytes,Ua: NEGATIVE
Nitrite: POSITIVE — AB
Protein, ur: 30 mg/dL — AB
Specific Gravity, Urine: 1.025 (ref 1.005–1.030)
pH: 6 (ref 5.0–8.0)

## 2021-07-23 MED ORDER — MORPHINE SULFATE (PF) 4 MG/ML IV SOLN
4.0000 mg | Freq: Once | INTRAVENOUS | Status: AC
  Administered 2021-07-23: 4 mg via INTRAVENOUS
  Filled 2021-07-23: qty 1

## 2021-07-23 MED ORDER — HYDROCODONE-ACETAMINOPHEN 5-325 MG PO TABS
1.0000 | ORAL_TABLET | Freq: Once | ORAL | Status: AC
  Administered 2021-07-23: 1 via ORAL
  Filled 2021-07-23: qty 1

## 2021-07-23 MED ORDER — LIDOCAINE HCL (PF) 1 % IJ SOLN
20.0000 mL | Freq: Once | INTRAMUSCULAR | Status: AC
  Administered 2021-07-23: 20 mL
  Filled 2021-07-23: qty 30

## 2021-07-23 NOTE — ED Provider Notes (Signed)
Emergency Medicine Provider Triage Evaluation Note  Ryan Lester , a 25 y.o. male  was evaluated in triage.  Pt complains of right testicular pain and swelling.  Patient was initially seen yesterday and had an ultrasound consistent with scrotal abscess.  I&D was performed and he was discharged on Augmentin as well as Percocet.  States he has been taking both medications as prescribed has been having worsening pain and swelling.  Denies any fevers, chills, nausea, vomiting, dysuria, or penile discharge.  Physical Exam  BP 131/73 (BP Location: Left Arm)   Pulse (!) 105   Temp 99.8 F (37.7 C) (Oral)   Resp 18   SpO2 96%  Gen:   Awake, no distress   Resp:  Normal effort  MSK:   Moves extremities without difficulty  Other:  Tenderness and edema noted along the right scrotal region.  Difficult to assess due to patient's pain.  Medical Decision Making  Medically screening exam initiated at 3:53 PM.  Appropriate orders placed.  Bryan D Keir was informed that the remainder of the evaluation will be completed by another provider, this initial triage assessment does not replace that evaluation, and the importance of remaining in the ED until their evaluation is complete.   Placido Sou, PA-C 07/23/21 1555    Gerhard Munch, MD 07/23/21 [phone]

## 2021-07-23 NOTE — Discharge Instructions (Signed)
It was pleasure taking care of you today.  As discussed, your ultrasound showed a collection of blood which was drained by the urologist.  Continue taking your antibiotics as previously prescribed.  You may take the pain medication as needed.  I have included the number of the urologist.  Please call to schedule an appointment for further evaluation.  Return to the ER for new or worsening symptoms.

## 2021-07-23 NOTE — ED Notes (Signed)
Pt left without receiving d/c paperwork or packing materials for wound

## 2021-07-23 NOTE — ED Notes (Signed)
All requested supplies bedside for urology
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
## 2021-07-23 NOTE — ED Triage Notes (Signed)
Per pt, states he was here yesterday and had an abscess lanced off right testicle-states now it is swollen-pain meds not working-has been taking antibiotic
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
## 2021-07-25 LAB — URINE CULTURE

## 2021-11-25 ENCOUNTER — Other Ambulatory Visit: Payer: Self-pay

## 2021-11-25 ENCOUNTER — Emergency Department (HOSPITAL_COMMUNITY)
Admission: EM | Admit: 2021-11-25 | Discharge: 2021-11-25 | Disposition: A | Discharge status: Home / Self Care | Payer: Self-pay | Attending: Emergency Medicine | Admitting: Emergency Medicine

## 2021-11-25 ENCOUNTER — Encounter (HOSPITAL_COMMUNITY): Payer: Self-pay

## 2021-11-25 ENCOUNTER — Emergency Department (HOSPITAL_COMMUNITY): Payer: Self-pay

## 2021-11-25 DIAGNOSIS — N492 Inflammatory disorders of scrotum: Secondary | ICD-10-CM | POA: Insufficient documentation

## 2021-11-25 MED ORDER — CEPHALEXIN 500 MG PO CAPS: 500.0000 mg | ORAL_CAPSULE | Freq: Four times a day (QID) | ORAL | 0 refills | Status: AC

## 2021-11-25 MED ORDER — LIDOCAINE HCL (PF) 1 % IJ SOLN
5.0000 mL | Freq: Once | INTRAMUSCULAR | Status: AC
  Administered 2021-11-25: 5 mL
  Filled 2021-11-25: qty 30

## 2021-11-25 MED ORDER — KETOROLAC TROMETHAMINE 15 MG/ML IJ SOLN
15.0000 mg | Freq: Once | INTRAMUSCULAR | Status: AC
  Administered 2021-11-25: 15 mg via INTRAMUSCULAR
  Filled 2021-11-25: qty 1

## 2021-11-25 MED ORDER — NAPROXEN 375 MG PO TABS: 375.0000 mg | ORAL_TABLET | Freq: Two times a day (BID) | ORAL | 0 refills | Status: DC

## 2021-11-25 MED ORDER — OXYCODONE-ACETAMINOPHEN 5-325 MG PO TABS
2.0000 | ORAL_TABLET | Freq: Once | ORAL | Status: AC
  Administered 2021-11-25: 2 via ORAL
  Filled 2021-11-25: qty 2

## 2021-11-25 NOTE — ED Triage Notes (Signed)
Patient reports having an abscess to the right testicle x 2 days.

## 2021-11-25 NOTE — ED Provider Triage Note (Signed)
Emergency Medicine Provider Triage Evaluation Note  Ryan Lester , a 26 y.o. male  was evaluated in triage.  Pt complains of abscess on scrotum  Review of Systems  Positive: pain Negative: fever  Physical Exam  BP (!) 147/105 (BP Location: Left Arm)    Pulse 92    Temp 98.7 F (37.1 C) (Oral)    Resp 16    Ht 6' (1.829 m)    Wt 122.5 kg    SpO2 95%    BMI 36.62 kg/m  Gen:   Awake, no distress   Resp:  Normal effort  MSK:   Moves extremities without difficulty  Other:    Medical Decision Making  Medically screening exam initiated at 8:31 AM.  Appropriate orders placed.  Destan D Taborda was informed that the remainder of the evaluation will be completed by another provider, this initial triage assessment does not replace that evaluation, and the importance of remaining in the ED until their evaluation is complete.     Elson Areas, [place] 11/25/21 [phone]

## 2021-11-25 NOTE — ED Provider Notes (Signed)
[facility]-EMERGENCY DEPT Provider Note   CSN: [id number] Arrival date & time: 11/25/21  0756     History  Chief Complaint  Patient presents with   Abscess    Ryan Lester is a 26 y.o. male.  With PMH previous scrotal abscess who presents the emergency department for evaluation of scrotal pain and concern for repeat abscess.  Patient states that for the last 48 hours he has had a developing painful swelling at the base of the right scrotum in the area of his last incision and drainage.  Denies nausea, vomiting, fevers or other systemic symptoms.  Denies dysuria or penile discharge.   Abscess     Home Medications Prior to Admission medications   Medication Sig Start Date End Date Taking? Authorizing Provider  ibuprofen (ADVIL) 200 MG tablet Take 400 mg by mouth every 6 (six) hours as needed (for pain).    [provider]  oxyCODONE-acetaminophen (PERCOCET/ROXICET) 5-325 MG tablet Take 1 tablet by mouth every 6 (six) hours as needed for severe pain. 07/22/21   Couture, Cortni S, PA-C  albuterol (PROVENTIL HFA;VENTOLIN HFA) 108 (90 Base) MCG/ACT inhaler Inhale 1-2 puffs into the lungs every 6 (six) hours as needed for wheezing. 02/25/18 04/10/20  Rolland Porter, MD      Allergies    Sulfa antibiotics    Review of Systems   Review of Systems  Skin:  Positive for rash and wound.   Physical Exam Updated Vital Signs BP (!) 147/105 (BP Location: Left Arm)    Pulse 92    Temp 98.7 F (37.1 C) (Oral)    Resp 16    Ht 6' (1.829 m)    Wt 122.5 kg    SpO2 95%    BMI 36.62 kg/m  Physical Exam Vitals and nursing note reviewed.  Constitutional:      General: He is not in acute distress.    Appearance: He is well-developed.  HENT:     Head: Normocephalic and atraumatic.  Eyes:     Conjunctiva/sclera: Conjunctivae normal.  Cardiovascular:     Rate and Rhythm: Normal rate and regular rhythm.     Heart sounds: No murmur heard. Pulmonary:     Effort:  Pulmonary effort is normal. No respiratory distress.     Breath sounds: Normal breath sounds.  Abdominal:     Palpations: Abdomen is soft.     Tenderness: There is no abdominal tenderness.  Genitourinary:    Comments: 3 cm area of tender fluctuance at the base of the right scrotum Musculoskeletal:     Cervical back: Neck supple.  Skin:    General: Skin is warm and dry.     Capillary Refill: Capillary refill takes less than 2 seconds.  Neurological:     Mental Status: He is alert.  Psychiatric:        Mood and Affect: Mood normal.    ED Results / Procedures / Treatments   Labs (all labs ordered are listed, but only abnormal results are displayed) Labs Reviewed - No data to display  EKG None  Radiology No results found.  Procedures .Marland KitchenIncision and Drainage  Date/Time: 11/25/2021 5:05 PM Performed by: Glendora Score, MD Authorized by: Glendora Score, MD   Consent:    Consent obtained:  Verbal Location:    Type:  Abscess   Size:  4cm   Location:  Anogenital   Anogenital location:  Scrotal wall Pre-procedure details:    Skin preparation:  Povidone-iodine Sedation:  Sedation type:  None Anesthesia:    Anesthesia method:  Local infiltration   Local anesthetic:  Lidocaine 1% w/o epi Procedure type:    Complexity:  Complex Procedure details:    Incision types:  Single straight   Incision depth:  Dermal   Wound management:  Probed and deloculated and extensive cleaning   Drainage:  Bloody and purulent   Drainage amount:  Moderate   Wound treatment:  Wound left open   Packing materials:  1/4 in iodoform gauze Post-procedure details:    Procedure completion:  Tolerated well, no immediate complications    Medications Ordered in ED Medications  lidocaine (PF) (XYLOCAINE) 1 % injection 5 mL (has no administration in time range)  oxyCODONE-acetaminophen (PERCOCET/ROXICET) 5-325 MG per tablet 2 tablet (2 tablets Oral Given 11/25/21 0981)    ED Course/ Medical  Decision Making/ A&P                           Medical Decision Making  Patient seen emergency department for evaluation of a scrotal abscess.  Physical exam reveals a 4 cm abscess in the right scrotal wall.  An ultrasound of the scrotum shows a developing abscess versus phlegmon.  Due to patient developing a hematoma in the site after urgency department incision and drainage last presentation, urology was consulted and they recommended outpatient follow-up and the ED drainage today based on their review of the ultrasound imaging.  I independently reviewed the ultrasound and agree with radiology findings.  A bedside incision and drainage was performed leading to evacuation of a moderate amount of bloody purulent foul-smelling fluid from the scrotal wall and the wound was packed with iodoform.  Patient was then discharged with Keflex and outpatient urology follow-up.  I had a discussion with the patient from a social determinants of health standpoint and patient will be able to afford his medication if we choose Keflex over [facility] and thus we choose to Keflex.        Final Clinical Impression(s) / ED Diagnoses Final diagnoses:  None    Rx / DC Orders ED Discharge Orders     None         Latrena Benegas, Wyn Forster, MD 11/25/21 1708

## 2021-12-13 ENCOUNTER — Other Ambulatory Visit: Payer: Self-pay

## 2021-12-13 ENCOUNTER — Encounter (HOSPITAL_COMMUNITY): Payer: Self-pay

## 2021-12-13 ENCOUNTER — Emergency Department (HOSPITAL_COMMUNITY)
Admission: EM | Admit: 2021-12-13 | Discharge: 2021-12-13 | Disposition: A | Discharge status: Home / Self Care | Payer: Self-pay | Attending: Emergency Medicine | Admitting: Emergency Medicine

## 2021-12-13 DIAGNOSIS — L02412 Cutaneous abscess of left axilla: Secondary | ICD-10-CM

## 2021-12-13 DIAGNOSIS — L02414 Cutaneous abscess of left upper limb: Secondary | ICD-10-CM | POA: Insufficient documentation

## 2021-12-13 MED ORDER — LIDOCAINE-EPINEPHRINE (PF) 2 %-1:200000 IJ SOLN
10.0000 mL | Freq: Once | INTRAMUSCULAR | Status: AC
  Administered 2021-12-13: 10 mL
  Filled 2021-12-13: qty 20

## 2021-12-13 MED ORDER — DOXYCYCLINE HYCLATE 100 MG PO CAPS: 100.0000 mg | ORAL_CAPSULE | Freq: Two times a day (BID) | ORAL | 0 refills | Status: DC

## 2021-12-13 NOTE — ED Provider Notes (Signed)
[facility] DEPT Provider Note   CSN: [id number] Arrival date & time: 12/13/21  [id number]     History  Chief Complaint  Patient presents with   Abscess    Ryan Lester is a 26 y.o. male.  The patient is here today complaining of an abscess in the left axillary area.  Patient states that he tried to drain the abscess at home using a razor blade with no success.  He has a history of multiple abscesses that have needed drainage over the past few years.       Home Medications Prior to Admission medications   Medication Sig Start Date End Date Taking? Authorizing Provider  ibuprofen (ADVIL) 200 MG tablet Take 400 mg by mouth every 6 (six) hours as needed (for pain).    [provider]  naproxen (NAPROSYN) 375 MG tablet Take 1 tablet (375 mg total) by mouth 2 (two) times daily. 11/25/21   Kommor, [place], MD  oxyCODONE-acetaminophen (PERCOCET/ROXICET) 5-325 MG tablet Take 1 tablet by mouth every 6 (six) hours as needed for severe pain. 07/22/21   Couture, Cortni S, PA-C  albuterol (PROVENTIL HFA;VENTOLIN HFA) 108 (90 Base) MCG/ACT inhaler Inhale 1-2 puffs into the lungs every 6 (six) hours as needed for wheezing. 02/25/18 04/10/20  Tanna Furry, MD      Allergies    Sulfa antibiotics    Review of Systems   Review of Systems  Constitutional:  Negative for chills and fever.  Respiratory:  Negative for cough and shortness of breath.   Cardiovascular:  Negative for chest pain.  Gastrointestinal:  Negative for vomiting.  Genitourinary:  Negative for dysuria and hematuria.  Musculoskeletal: Negative.   Skin:        Abscess under left armpit  All other systems reviewed and are negative.  Physical Exam Updated Vital Signs BP 125/84 (BP Location: Right Arm)    Pulse 87    Temp 98.4 F (36.9 C) (Oral)    Resp 18    Ht 6' (1.829 m)    Wt 127 kg    SpO2 100%    BMI 37.97 kg/m  Physical Exam HENT:     Head: Normocephalic and atraumatic.  Eyes:      Pupils: Pupils are equal, round, and reactive to light.  Skin:    Comments: Fluctuant mass noted in left axillary area measuring 8 cm x 6 cm  Neurological:     Mental Status: He is alert.    ED Results / Procedures / Treatments   Labs (all labs ordered are listed, but only abnormal results are displayed) Labs Reviewed - No data to display  EKG None  Radiology No results found.  Procedures .Marland KitchenIncision and Drainage  Date/Time: 12/13/2021 11:22 AM Performed by: Dorothyann Peng, PA Authorized by: Dorothyann Peng, PA   Consent:    Consent obtained:  Verbal   Consent given by:  Patient   Risks, benefits, and alternatives were discussed: yes     Risks discussed:  Bleeding, incomplete drainage, pain and infection   Alternatives discussed:  No treatment Universal protocol:    Procedure explained and questions answered to patient or proxy's satisfaction: yes     Required blood products, implants, devices, and special equipment available: yes     Site/side marked: yes     Immediately prior to procedure, a time out was called: yes     Patient identity confirmed:  Verbally with patient Location:    Type:  Abscess  Size:  8cm x 6cm   Location:  Upper extremity   Upper extremity location:  Arm   Arm location:  L upper arm Pre-procedure details:    Skin preparation:  Povidone-iodine Sedation:    Sedation type:  None Anesthesia:    Anesthesia method:  Local infiltration   Local anesthetic:  Lidocaine 2% WITH epi Procedure type:    Complexity:  Simple Procedure details:    Ultrasound guidance: no     Needle aspiration: no     Incision types:  Single straight   Incision depth:  Submucosal   Wound management:  Probed and deloculated   Drainage:  Purulent   Drainage amount:  Copious   Wound treatment:  Wound left open   Packing materials:  1/4 in iodoform gauze   Amount 1/4" iodoform:  16cm Post-procedure details:    Procedure completion:  Tolerated    Medications  Ordered in ED Medications  lidocaine-EPINEPHrine (XYLOCAINE W/EPI) 2 %-1:200000 (PF) injection 10 mL (has no administration in time range)    ED Course/ Medical Decision Making/ A&P                           Medical Decision Making Presented with fluctuant mass left axillary area measuring 8 cm x 6 cm.  Differential includes but not exclusive to abscess, boils, cellulitis.  After discussion with patient patient elected for incision and drainage.  See procedure note.  Patient tolerated procedure well.  Follow-up and return instructions provided.  Risk Prescription drug management.    Final Clinical Impression(s) / ED Diagnoses Final diagnoses:  None    Rx / DC Orders ED Discharge Orders     None         Dorothyann Peng, [place] 12/13/21 1131    Tegeler, Gwenyth Allegra, MD 12/13/21 1319

## 2021-12-13 NOTE — ED Triage Notes (Signed)
Patient c/o an abscess to the left axilla x 3 days. 

## 2021-12-13 NOTE — Discharge Instructions (Addendum)
You were diagnosed with a skin abscess.  The wound was drained and packed.  The packing material may be removed by you or by a healthcare provider after three days. You were also prescribed a course of doxycycline.  You should finish the full prescription. Return to the emergency department if symptoms worsen including high fever or shortness of breath

## 2022-02-04 ENCOUNTER — Encounter (HOSPITAL_COMMUNITY): Payer: Self-pay

## 2022-02-04 ENCOUNTER — Emergency Department (HOSPITAL_COMMUNITY)
Admission: EM | Admit: 2022-02-04 | Discharge: 2022-02-04 | Disposition: A | Discharge status: Home / Self Care | Payer: Self-pay | Attending: Emergency Medicine | Admitting: Emergency Medicine

## 2022-02-04 ENCOUNTER — Other Ambulatory Visit: Payer: Self-pay

## 2022-02-04 DIAGNOSIS — L0291 Cutaneous abscess, unspecified: Secondary | ICD-10-CM

## 2022-02-04 DIAGNOSIS — N492 Inflammatory disorders of scrotum: Secondary | ICD-10-CM | POA: Insufficient documentation

## 2022-02-04 MED ORDER — DOXYCYCLINE HYCLATE 100 MG PO CAPS: 100.0000 mg | ORAL_CAPSULE | Freq: Two times a day (BID) | ORAL | 0 refills | Status: DC

## 2022-02-04 MED ORDER — DOXYCYCLINE HYCLATE 100 MG PO TABS
100.0000 mg | ORAL_TABLET | Freq: Once | ORAL | Status: AC
  Administered 2022-02-04: 100 mg via ORAL
  Filled 2022-02-04: qty 1

## 2022-02-04 MED ORDER — LIDOCAINE-EPINEPHRINE (PF) 2 %-1:200000 IJ SOLN
10.0000 mL | Freq: Once | INTRAMUSCULAR | Status: AC
  Administered 2022-02-04: 10 mL via INTRADERMAL
  Filled 2022-02-04: qty 20

## 2022-02-04 MED ORDER — DIAZEPAM 5 MG PO TABS
5.0000 mg | ORAL_TABLET | Freq: Once | ORAL | Status: AC
  Administered 2022-02-04: 5 mg via ORAL
  Filled 2022-02-04: qty 1

## 2022-02-04 MED ORDER — OXYCODONE HCL 5 MG PO TABS
5.0000 mg | ORAL_TABLET | Freq: Once | ORAL | Status: AC
  Administered 2022-02-04: 5 mg via ORAL
  Filled 2022-02-04: qty 1

## 2022-02-04 MED ORDER — ACETAMINOPHEN 500 MG PO TABS
1000.0000 mg | ORAL_TABLET | Freq: Once | ORAL | Status: AC
  Administered 2022-02-04: 1000 mg via ORAL
  Filled 2022-02-04: qty 2

## 2022-02-04 NOTE — ED Triage Notes (Signed)
Pt reports with right testicle abscess x 2 days. Pt states that he gets abscesses frequently.  ?

## 2022-02-04 NOTE — Discharge Instructions (Signed)
Warm compresses at least 4 times a day please return for rapid spreading redness or if you develop a fever.  As you get these recurrently it might benefit you to follow-up with your family doctor for this or if given information for the general surgeons if you wish. ?

## 2022-02-04 NOTE — ED Provider Notes (Signed)
?[facility]-EMERGENCY DEPT ?Provider Note ? ? ?CSN: [id number] ?Arrival date & time: 02/04/22  2037 ? ?  ? ?History ? ?Chief Complaint  ?Patient presents with  ? Abscess  ? ? ?Ryan Lester is a 26 y.o. male. ? ?26 yo M with a cc of a periscrotal abscess.   This has happened multiple times in the past.  Going on for a couple days.  No fevers, chills.   ? ? ?Abscess ? ?  ? ?Home Medications ?Prior to Admission medications   ?Medication Sig Start Date End Date Taking? Authorizing Provider  ?doxycycline (VIBRAMYCIN) 100 MG capsule Take 1 capsule (100 mg total) by mouth 2 (two) times daily. One po bid x 7 days 02/04/22  Yes Melene PlanFloyd, Preciliano Castell, DO  ?ibuprofen (ADVIL) 200 MG tablet Take 400 mg by mouth every 6 (six) hours as needed (for pain).    [provider]  ?naproxen (NAPROSYN) 375 MG tablet Take 1 tablet (375 mg total) by mouth 2 (two) times daily. 11/25/21   Kommor, Madison, MD  ?oxyCODONE-acetaminophen (PERCOCET/ROXICET) 5-325 MG tablet Take 1 tablet by mouth every 6 (six) hours as needed for severe pain. 07/22/21   Couture, Cortni S, PA-C  ?albuterol (PROVENTIL HFA;VENTOLIN HFA) 108 (90 Base) MCG/ACT inhaler Inhale 1-2 puffs into the lungs every 6 (six) hours as needed for wheezing. 02/25/18 04/10/20  Rolland PorterJames, Mark, MD  ?   ? ?Allergies    ?Sulfa antibiotics   ? ?Review of Systems   ?Review of Systems ? ?Physical Exam ?Updated Vital Signs ?BP 128/87 (BP Location: Left Arm)   Pulse (!) 110   Temp 99.8 ?F (37.7 ?C) (Oral)   Resp 18   Ht 6' (1.829 m)   Wt 126.1 kg   SpO2 98%   BMI 37.70 kg/m?  ?Physical Exam ?Vitals and nursing note reviewed.  ?Constitutional:   ?   Appearance: He is well-developed.  ?HENT:  ?   Head: Normocephalic and atraumatic.  ?Eyes:  ?   Pupils: Pupils are equal, round, and reactive to light.  ?Neck:  ?   Vascular: No JVD.  ?Cardiovascular:  ?   Rate and Rhythm: Normal rate and regular rhythm.  ?   Heart sounds: No murmur heard. ?  No friction rub. No gallop.  ?Pulmonary:   ?   Effort: No respiratory distress.  ?   Breath sounds: No wheezing.  ?Abdominal:  ?   General: There is no distension.  ?   Tenderness: There is no abdominal tenderness. There is no guarding or rebound.  ?Genitourinary: ?   Comments: Fullness and fluctuance to the right periscrotal region. ?Musculoskeletal:     ?   General: Normal range of motion.  ?   Cervical back: Normal range of motion and neck supple.  ?Skin: ?   Coloration: Skin is not pale.  ?   Findings: No rash.  ?Neurological:  ?   Mental Status: He is alert and oriented to person, place, and time.  ?Psychiatric:     ?   Behavior: Behavior normal.  ? ? ?ED Results / Procedures / Treatments   ?Labs ?(all labs ordered are listed, but only abnormal results are displayed) ?Labs Reviewed - No data to display ? ?EKG ?None ? ?Radiology ?No results found. ? ?Procedures ?Marland Kitchen..Incision and Drainage ? ?Date/Time: 02/04/2022 10:37 PM ?Performed by: Melene PlanFloyd, Otis Portal, DO ?Authorized by: Melene PlanFloyd, Reema Chick, DO  ? ?Consent:  ?  Consent obtained:  Verbal ?  Consent given by:  Patient ?  Risks, benefits, and alternatives were discussed: yes   ?  Risks discussed:  Bleeding, incomplete drainage and damage to other organs ?  Alternatives discussed:  No treatment ?Universal protocol:  ?  Procedure explained and questions answered to patient or proxy's satisfaction: yes   ?  Immediately prior to procedure, a time out was called: yes   ?  Patient identity confirmed:  Verbally with patient ?Location:  ?  Type:  Abscess ?  Location:  Anogenital ?  Anogenital location:  Scrotal space ?Pre-procedure details:  ?  Skin preparation:  Chlorhexidine ?Sedation:  ?  Sedation type:  None ?Anesthesia:  ?  Anesthesia method:  Local infiltration ?  Local anesthetic:  Lidocaine 2% WITH epi ?Procedure type:  ?  Complexity:  Complex ?Procedure details:  ?  Ultrasound guidance: no   ?  Needle aspiration: no   ?  Incision types:  Single straight ?  Incision depth:  Submucosal ?  Wound management:  Probed and  deloculated ?  Drainage:  Purulent and bloody ?  Drainage amount:  Moderate ?  Wound treatment:  Wound left open ?  Packing materials:  None ?Post-procedure details:  ?  Procedure completion:  Tolerated well, no immediate complications  ? ? ?Medications Ordered in ED ?Medications  ?acetaminophen (TYLENOL) tablet 1,000 mg (1,000 mg Oral Given 02/04/22 2134)  ?oxyCODONE (Oxy IR/ROXICODONE) immediate release tablet 5 mg (5 mg Oral Given 02/04/22 2135)  ?lidocaine-EPINEPHrine (XYLOCAINE W/EPI) 2 %-1:200000 (PF) injection 10 mL (10 mLs Intradermal Given by Other 02/04/22 2234)  ?diazepam (VALIUM) tablet 5 mg (5 mg Oral Given 02/04/22 2134)  ?doxycycline (VIBRA-TABS) tablet 100 mg (100 mg Oral Given 02/04/22 2238)  ? ? ?ED Course/ Medical Decision Making/ A&P ?  ?                        ?Medical Decision Making ?Risk ?OTC drugs. ?Prescription drug management. ? ? ?26 yo M with a significant past medical history of multiple abscesses in the past comes in with a chief complaints of periscrotal abscess.  Is not his first time that he has been here for this.  He has a scar in the same area.  I&D was performed with purulent drainage.  Will start on oral antibiotics.  We will have him follow-up with general surgery. ? ?10:38 PM:  I have discussed the diagnosis/risks/treatment options with the patient.  Evaluation and diagnostic testing in the emergency department does not suggest an emergent condition requiring admission or immediate intervention beyond what has been performed at this time.  They will follow up with PCP, gen surgery. We also discussed returning to the ED immediately if new or worsening sx occur. We discussed the sx which are most concerning (e.g., sudden worsening pain, fever, inability to tolerate by mouth, rapid spreading redness) that necessitate immediate return. Medications administered to the patient during their visit and any new prescriptions provided to the patient are listed below. ? ?Medications given  during this visit ?Medications  ?acetaminophen (TYLENOL) tablet 1,000 mg (1,000 mg Oral Given 02/04/22 2134)  ?oxyCODONE (Oxy IR/ROXICODONE) immediate release tablet 5 mg (5 mg Oral Given 02/04/22 2135)  ?lidocaine-EPINEPHrine (XYLOCAINE W/EPI) 2 %-1:200000 (PF) injection 10 mL (10 mLs Intradermal Given by Other 02/04/22 2234)  ?diazepam (VALIUM) tablet 5 mg (5 mg Oral Given 02/04/22 2134)  ?doxycycline (VIBRA-TABS) tablet 100 mg (100 mg Oral Given 02/04/22 2238)  ? ? ? ?The patient appears reasonably screen and/or stabilized for discharge  and I doubt any other medical condition or other [facility] requiring further screening, evaluation, or treatment in the ED at this time prior to discharge.  ? ? ? ? ? ? ? ? ?Final Clinical Impression(s) / ED Diagnoses ?Final diagnoses:  ?Abscess  ? ? ?Rx / DC Orders ?ED Discharge Orders   ? ?      Ordered  ?  doxycycline (VIBRAMYCIN) 100 MG capsule  2 times daily       ? 02/04/22 2219  ? ?  ?  ? ?  ? ? ?  ?Melene Plan, DO ?02/04/22 2238 ? ?

## 2022-02-06 ENCOUNTER — Encounter (HOSPITAL_COMMUNITY)
Admission: EM | Disposition: A | Discharge status: Home / Self Care | Payer: Self-pay | Source: Home / Self Care | Attending: Emergency Medicine

## 2022-02-06 ENCOUNTER — Emergency Department (HOSPITAL_COMMUNITY): Payer: Self-pay

## 2022-02-06 ENCOUNTER — Encounter (HOSPITAL_COMMUNITY): Payer: Self-pay

## 2022-02-06 ENCOUNTER — Observation Stay (HOSPITAL_COMMUNITY): Payer: Self-pay | Admitting: Certified Registered Nurse Anesthetist

## 2022-02-06 ENCOUNTER — Ambulatory Visit (HOSPITAL_COMMUNITY)
Admission: EM | Admit: 2022-02-06 | Discharge: 2022-02-06 | Disposition: A | Discharge status: Home / Self Care | Payer: Self-pay | Attending: Urology | Admitting: Urology

## 2022-02-06 ENCOUNTER — Other Ambulatory Visit: Payer: Self-pay

## 2022-02-06 ENCOUNTER — Observation Stay (HOSPITAL_BASED_OUTPATIENT_CLINIC_OR_DEPARTMENT_OTHER): Payer: Self-pay | Admitting: Certified Registered Nurse Anesthetist

## 2022-02-06 DIAGNOSIS — N492 Inflammatory disorders of scrotum: Secondary | ICD-10-CM

## 2022-02-06 DIAGNOSIS — A419 Sepsis, unspecified organism: Secondary | ICD-10-CM | POA: Insufficient documentation

## 2022-02-06 DIAGNOSIS — Z6837 Body mass index (BMI) 37.0-37.9, adult: Secondary | ICD-10-CM

## 2022-02-06 DIAGNOSIS — R6510 Systemic inflammatory response syndrome (SIRS) of non-infectious origin without acute organ dysfunction: Secondary | ICD-10-CM | POA: Insufficient documentation

## 2022-02-06 HISTORY — PX: INCISION AND DRAINAGE ABSCESS: SHX5864

## 2022-02-06 LAB — CBC WITH DIFFERENTIAL/PLATELET
Abs Immature Granulocytes: 0.05 10*3/uL (ref 0.00–0.07)
Basophils Absolute: 0.1 10*3/uL (ref 0.0–0.1)
Basophils Relative: 1 %
Eosinophils Absolute: 0.1 10*3/uL (ref 0.0–0.5)
Eosinophils Relative: 1 %
HCT: 49.4 % (ref 39.0–52.0)
Hemoglobin: 15.9 g/dL (ref 13.0–17.0)
Immature Granulocytes: 0 %
Lymphocytes Relative: 13 %
Lymphs Abs: 1.8 10*3/uL (ref 0.7–4.0)
MCH: 29.2 pg (ref 26.0–34.0)
MCHC: 32.2 g/dL (ref 30.0–36.0)
MCV: 90.6 fL (ref 80.0–100.0)
Monocytes Absolute: 1.1 10*3/uL — ABNORMAL HIGH (ref 0.1–1.0)
Monocytes Relative: 8 %
Neutro Abs: 10.1 10*3/uL — ABNORMAL HIGH (ref 1.7–7.7)
Neutrophils Relative %: 77 %
Platelets: 301 10*3/uL (ref 150–400)
RBC: 5.45 MIL/uL (ref 4.22–5.81)
RDW: 13.9 % (ref 11.5–15.5)
WBC: 13.2 10*3/uL — ABNORMAL HIGH (ref 4.0–10.5)
nRBC: 0 % (ref 0.0–0.2)

## 2022-02-06 LAB — BASIC METABOLIC PANEL
Anion gap: 6 (ref 5–15)
BUN: 8 mg/dL (ref 6–20)
CO2: 24 mmol/L (ref 22–32)
Calcium: 9.3 mg/dL (ref 8.9–10.3)
Chloride: 107 mmol/L (ref 98–111)
Creatinine, Ser: 1.04 mg/dL (ref 0.61–1.24)
GFR, Estimated: >60 mL/min (ref >60)
Glucose, Bld: 85 mg/dL (ref 70–99)
Potassium: 3.9 mmol/L (ref 3.5–5.1)
Sodium: 137 mmol/L (ref 135–145)

## 2022-02-06 SURGERY — INCISION AND DRAINAGE, ABSCESS
Anesthesia: General | Site: Scrotum

## 2022-02-06 MED ORDER — FENTANYL CITRATE (PF) 100 MCG/2ML IJ SOLN
INTRAMUSCULAR | Status: DC | PRN
  Administered 2022-02-06: 100 ug via INTRAVENOUS
  Administered 2022-02-06: 50 ug via INTRAVENOUS

## 2022-02-06 MED ORDER — LIDOCAINE 2% (20 MG/ML) 5 ML SYRINGE
INTRAMUSCULAR | Status: DC | PRN
  Administered 2022-02-06: 100 mg via INTRAVENOUS

## 2022-02-06 MED ORDER — KETOROLAC TROMETHAMINE 30 MG/ML IJ SOLN: 30.0000 mg | Freq: Once | INTRAMUSCULAR | Status: DC | PRN

## 2022-02-06 MED ORDER — LIDOCAINE HCL (PF) 2 % IJ SOLN
INTRAMUSCULAR | Status: AC
  Filled 2022-02-06: qty 5

## 2022-02-06 MED ORDER — MIDAZOLAM HCL 2 MG/2ML IJ SOLN
INTRAMUSCULAR | Status: AC
  Filled 2022-02-06: qty 2

## 2022-02-06 MED ORDER — PROPOFOL 10 MG/ML IV BOLUS
INTRAVENOUS | Status: AC
  Filled 2022-02-06: qty 20

## 2022-02-06 MED ORDER — FENTANYL CITRATE PF 50 MCG/ML IJ SOSY
PREFILLED_SYRINGE | INTRAMUSCULAR | Status: AC
  Filled 2022-02-06: qty 1

## 2022-02-06 MED ORDER — AMISULPRIDE (ANTIEMETIC) 5 MG/2ML IV SOLN: 10.0000 mg | Freq: Once | INTRAVENOUS | Status: DC | PRN

## 2022-02-06 MED ORDER — MIDAZOLAM HCL 5 MG/5ML IJ SOLN
INTRAMUSCULAR | Status: DC | PRN
  Administered 2022-02-06: 4 mg via INTRAVENOUS

## 2022-02-06 MED ORDER — ONDANSETRON HCL 4 MG/2ML IJ SOLN
INTRAMUSCULAR | Status: DC | PRN
  Administered 2022-02-06: 4 mg via INTRAVENOUS

## 2022-02-06 MED ORDER — ACETAMINOPHEN 10 MG/ML IV SOLN
INTRAVENOUS | Status: AC
  Filled 2022-02-06: qty 100

## 2022-02-06 MED ORDER — KETOROLAC TROMETHAMINE 30 MG/ML IJ SOLN
INTRAMUSCULAR | Status: DC | PRN
  Administered 2022-02-06: 30 mg via INTRAVENOUS

## 2022-02-06 MED ORDER — BUPIVACAINE-EPINEPHRINE 0.5% -1:200000 IJ SOLN
INTRAMUSCULAR | Status: DC | PRN
  Administered 2022-02-06: 5 mL

## 2022-02-06 MED ORDER — OXYCODONE-ACETAMINOPHEN 5-325 MG PO TABS
1.0000 | ORAL_TABLET | Freq: Once | ORAL | Status: AC
Start: 2022-02-06 — End: 2022-02-06
  Administered 2022-02-06: 1 via ORAL
  Filled 2022-02-06: qty 1

## 2022-02-06 MED ORDER — FENTANYL CITRATE (PF) 100 MCG/2ML IJ SOLN
INTRAMUSCULAR | Status: AC
Start: 2022-02-06 — End: ?
  Filled 2022-02-06: qty 2

## 2022-02-06 MED ORDER — LACTATED RINGERS IV SOLN
INTRAVENOUS | Status: DC | PRN
Start: 2022-02-06 — End: 2022-02-06

## 2022-02-06 MED ORDER — SODIUM CHLORIDE 0.9 % IV BOLUS: 1000.0000 mL | Freq: Once | INTRAVENOUS | Status: DC

## 2022-02-06 MED ORDER — ONDANSETRON HCL 4 MG/2ML IJ SOLN: 4.0000 mg | Freq: Once | INTRAMUSCULAR | Status: DC | PRN

## 2022-02-06 MED ORDER — HYDROMORPHONE HCL 2 MG/ML IJ SOLN
INTRAMUSCULAR | Status: AC
  Filled 2022-02-06: qty 1

## 2022-02-06 MED ORDER — 0.9 % SODIUM CHLORIDE (POUR BTL) OPTIME
TOPICAL | Status: DC | PRN
  Administered 2022-02-06: 1000 mL

## 2022-02-06 MED ORDER — ONDANSETRON HCL 4 MG/2ML IJ SOLN
INTRAMUSCULAR | Status: AC
  Filled 2022-02-06: qty 2

## 2022-02-06 MED ORDER — PIPERACILLIN-TAZOBACTAM 3.375 G IVPB
INTRAVENOUS | Status: AC
  Filled 2022-02-06: qty 50

## 2022-02-06 MED ORDER — DEXAMETHASONE SODIUM PHOSPHATE 10 MG/ML IJ SOLN
INTRAMUSCULAR | Status: AC
  Filled 2022-02-06: qty 1

## 2022-02-06 MED ORDER — OXYCODONE HCL 5 MG/5ML PO SOLN: 5.0000 mg | Freq: Once | ORAL | Status: DC | PRN

## 2022-02-06 MED ORDER — OXYCODONE HCL 5 MG PO TABS: 5.0000 mg | ORAL_TABLET | Freq: Once | ORAL | Status: DC | PRN

## 2022-02-06 MED ORDER — KETAMINE HCL 50 MG/5ML IJ SOSY
PREFILLED_SYRINGE | INTRAMUSCULAR | Status: AC
  Filled 2022-02-06: qty 5

## 2022-02-06 MED ORDER — ACETAMINOPHEN 10 MG/ML IV SOLN
INTRAVENOUS | Status: DC | PRN
  Administered 2022-02-06: 1000 mg via INTRAVENOUS

## 2022-02-06 MED ORDER — LIDOCAINE HCL (PF) 1 % IJ SOLN
INTRAMUSCULAR | Status: AC
  Filled 2022-02-06: qty 30

## 2022-02-06 MED ORDER — VANCOMYCIN HCL 2000 MG/400ML IV SOLN
2000.0000 mg | Freq: Once | INTRAVENOUS | Status: DC
Start: 2022-02-06 — End: 2022-02-07
  Filled 2022-02-06: qty 400

## 2022-02-06 MED ORDER — LIDOCAINE HCL 1 % IJ SOLN
INTRAMUSCULAR | Status: DC | PRN
  Administered 2022-02-06: 5 mL

## 2022-02-06 MED ORDER — HYDROMORPHONE HCL 1 MG/ML IJ SOLN
INTRAMUSCULAR | Status: DC | PRN
  Administered 2022-02-06: 1 mg via INTRAVENOUS
  Administered 2022-02-06: 2 mg via INTRAVENOUS
  Administered 2022-02-06: 1 mg via INTRAVENOUS

## 2022-02-06 MED ORDER — PIPERACILLIN-TAZOBACTAM 3.375 G IVPB
3.3750 g | Freq: Once | INTRAVENOUS | Status: AC
  Administered 2022-02-06 (×2): 3.375 g via INTRAVENOUS

## 2022-02-06 MED ORDER — HYDROMORPHONE HCL 1 MG/ML IJ SOLN: 0.2500 mg | INTRAMUSCULAR | Status: DC | PRN

## 2022-02-06 MED ORDER — KETAMINE HCL 10 MG/ML IJ SOLN
INTRAMUSCULAR | Status: DC | PRN
  Administered 2022-02-06: 50 mg via INTRAVENOUS

## 2022-02-06 MED ORDER — SUCCINYLCHOLINE CHLORIDE 200 MG/10ML IV SOSY
PREFILLED_SYRINGE | INTRAVENOUS | Status: AC
  Filled 2022-02-06: qty 10

## 2022-02-06 MED ORDER — PROPOFOL 10 MG/ML IV BOLUS
INTRAVENOUS | Status: DC | PRN
  Administered 2022-02-06: 200 mg via INTRAVENOUS

## 2022-02-06 MED ORDER — NALOXONE HCL 0.4 MG/ML IJ SOLN
INTRAMUSCULAR | Status: AC
  Filled 2022-02-06: qty 1

## 2022-02-06 MED ORDER — OXYCODONE-ACETAMINOPHEN 5-325 MG PO TABS
1.0000 | ORAL_TABLET | Freq: Once | ORAL | Status: AC
  Administered 2022-02-06: 1 via ORAL
  Filled 2022-02-06: qty 1

## 2022-02-06 MED ORDER — DEXAMETHASONE SODIUM PHOSPHATE 10 MG/ML IJ SOLN
INTRAMUSCULAR | Status: DC | PRN
  Administered 2022-02-06: 10 mg via INTRAVENOUS

## 2022-02-06 MED ORDER — BUPIVACAINE-EPINEPHRINE (PF) 0.5% -1:200000 IJ SOLN
INTRAMUSCULAR | Status: AC
  Filled 2022-02-06: qty 30

## 2022-02-06 MED ORDER — MEPERIDINE HCL 50 MG/ML IJ SOLN: 6.2500 mg | INTRAMUSCULAR | Status: DC | PRN

## 2022-02-06 SURGICAL SUPPLY — 19 items
BAG COUNTER SPONGE SURGICOUNT (BAG) IMPLANT
BNDG GAUZE ELAST 4 BULKY (GAUZE/BANDAGES/DRESSINGS) ×1 IMPLANT
DRAPE SHEET LG 3/4 BI-LAMINATE (DRAPES) ×2 IMPLANT
DRSG PAD ABDOMINAL 8X10 ST (GAUZE/BANDAGES/DRESSINGS) ×1 IMPLANT
ELECT REM PT RETURN 15FT ADLT (MISCELLANEOUS) ×2 IMPLANT
GAUZE SPONGE 4X4 12PLY STRL (GAUZE/BANDAGES/DRESSINGS) ×2 IMPLANT
GLOVE SURG NEOPR MICRO LF SZ8 (GLOVE) ×2 IMPLANT
GLOVE SURG UNDER LTX SZ8 (GLOVE) ×4 IMPLANT
GLOVE SURG UNDER POLY LF SZ7 (GLOVE) ×2 IMPLANT
GOWN STRL REUS W/ TWL XL LVL3 (GOWN DISPOSABLE) ×2 IMPLANT
GOWN STRL REUS W/TWL XL LVL3 (GOWN DISPOSABLE) ×4
KIT BASIN OR (CUSTOM PROCEDURE TRAY) ×2 IMPLANT
KIT TURNOVER KIT A (KITS) IMPLANT
NS IRRIG 1000ML POUR BTL (IV SOLUTION) ×2 IMPLANT
PACK GENERAL/GYN (CUSTOM PROCEDURE TRAY) ×2 IMPLANT
SWAB COLLECTION DEVICE MRSA (MISCELLANEOUS) ×1 IMPLANT
SWAB CULTURE ESWAB REG 1ML (MISCELLANEOUS) ×1 IMPLANT
TOWEL OR 17X26 10 PK STRL BLUE (TOWEL DISPOSABLE) ×2 IMPLANT
UNDERPAD 30X36 HEAVY ABSORB (UNDERPADS AND DIAPERS) ×1 IMPLANT

## 2022-02-06 NOTE — ED Triage Notes (Signed)
Pt reports abscess to testicles. Pt reports coming here Tuesday and having abscess drained. Pt reports being unable to get antibiotics due to cost.  ?

## 2022-02-06 NOTE — Discharge Instructions (Addendum)
Wet-to-dry dressing change:  ? ?Supplies-all these items are available at your local pharmacy: ?-Saline solution (or contact solution) ?-Gauze (either 2?2 or 4?4) ?-Tape ? ?INSTRUCTIONS: ?To best prepare and use wet-to-dry dressings, follow these instructions: ? ?Open the gauze fully. ?Wet the gauze with the saline or contact solution. ?Squeeze the gauze until it is almost dry. ?Pack the gauze ONLY into the open area of the wound. ?Cover the wound with dry gauze. ?Use the tape to secure the gauze over the wound. ?Repeat this process two (2) times a day. ?

## 2022-02-06 NOTE — ED Provider Notes (Signed)
?  Face-to-face evaluation ? ? ?History: Patient presenting with recurrent and worsening scrotal pain and swelling following I&D, in the ER, 2 days ago.  He has had recurrent abscesses of scrotum and axilla drained in the past. ? ?Physical exam: Alert, calm, uncomfortable.  Penis normal appearance.  Right scrotum is tender, indurated and deformed.  There is a lateral scrotal wall mass as well.  There is no clear pointing or areas of potential drainage outside of the scrotum.  Clinically he has not intrascrotal abscess. ? ?MDM: Evaluation for  ?Chief Complaint  ?Patient presents with  ? Abscess  ?  ? ?Patient with recurrent pain and swelling in the right scrotum.  Ultrasound imaging is consistent with intrascrotal abscess requiring operative management.  Patient has significant pain and will need to be admitted for stabilization and operative intervention by urology.  We will start broad-spectrum antibiotics and given narcotic analgesia. ? ?Medical screening examination/treatment/procedure(s) were conducted as a shared visit with non-physician practitioner(s) and myself.  I personally evaluated the patient during the encounter ? ?  ?Mancel Bale, MD ?02/06/22 2232 ? ?

## 2022-02-06 NOTE — Progress Notes (Signed)
A consult was received from an ED physician for vanc/zosyn per pharmacy dosing.  The patient's profile has been reviewed for ht/wt/allergies/indication/available labs.   ?A one time order has been placed for vanc 2g and zosyn 3.375g.  Further antibiotics/pharmacy consults should be ordered by admitting physician if indicated.       ?                ?Thank you, ?Berkley Harvey ?02/06/2022  8:15 PM ? ?

## 2022-02-06 NOTE — Transfer of Care (Signed)
Immediate Anesthesia Transfer of Care Note ? ?Patient: Ryan Lester ? ?Procedure(s) Performed: INCISION AND DRAINAGE ABSCESS (Scrotum) ? ?Patient Location: PACU ? ?Anesthesia Type:General ? ?Level of Consciousness: drowsy ? ?Airway & Oxygen Therapy: Patient Spontanous Breathing and Patient connected to face mask ? ?Post-op Assessment: Report given to RN and Post -op Vital signs reviewed and stable ? ?Post vital signs: Reviewed and stable ? ?Last Vitals:  ?Vitals Value Taken Time  ?BP 148/90 02/06/22 2215  ?Temp    ?Pulse 103 02/06/22 2219  ?Resp 23 02/06/22 2219  ?SpO2 95 % 02/06/22 2219  ?Vitals shown include unvalidated device data. ? ?Last Pain:  ?Vitals:  ? 02/06/22 1542  ?TempSrc:   ?PainSc: 8   ?   ? ?  ? ?Complications: No notable events documented. ?

## 2022-02-06 NOTE — Anesthesia Procedure Notes (Signed)
Procedure Name: LMA Insertion ?Date/Time: 02/06/2022 9:19 PM ?Performed by: Sudie Grumbling, CRNA ?Pre-anesthesia Checklist: Patient identified, Emergency Drugs available, Suction available and Patient being monitored ?Patient Re-evaluated:Patient Re-evaluated prior to induction ?Oxygen Delivery Method: Circle system utilized ?Preoxygenation: Pre-oxygenation with 100% oxygen ?Induction Type: IV induction ?LMA: LMA with gastric port inserted ?LMA Size: 5.0 ?Tube type: Oral ?Number of attempts: 1 ?Placement Confirmation: positive ETCO2 and breath sounds checked- equal and bilateral ?Tube secured with: Tape ?Dental Injury: Teeth and Oropharynx as per pre-operative assessment  ? ? ? ? ?

## 2022-02-06 NOTE — Anesthesia Postprocedure Evaluation (Signed)
Anesthesia Post Note ? ?Patient: Ryan Lester ? ?Procedure(s) Performed: INCISION AND DRAINAGE ABSCESS (Scrotum) ? ?  ? ?Patient location during evaluation: PACU ?Anesthesia Type: General ?Level of consciousness: awake and alert, oriented and patient cooperative ?Pain management: pain level controlled ?Vital Signs Assessment: post-procedure vital signs reviewed and stable ?Respiratory status: spontaneous breathing, nonlabored ventilation and respiratory function stable ?Cardiovascular status: blood pressure returned to baseline and stable ?Postop Assessment: no apparent nausea or vomiting ?Anesthetic complications: no ? ? ?No notable events documented. ? ?Last Vitals:  ?Vitals:  ? 02/06/22 2240 02/06/22 2245  ?BP:  (!) 145/77  ?Pulse: 100 98  ?Resp: 14 16  ?Temp:    ?SpO2: 92% 93%  ?  ?Last Pain:  ?Vitals:  ? 02/06/22 2245  ?TempSrc:   ?PainSc: 0-No pain  ? ? ?  ?  ?  ?  ?  ?  ? ?Tennis Must [name] ? ? ? ? ?

## 2022-02-06 NOTE — ED Provider Triage Note (Signed)
Emergency Medicine Provider Triage Evaluation Note ? ?Razi Lorretta Harp , a 26 y.o. male  was evaluated in triage.  Pt complains of worsening right peri-scrotal abscess after drainage the other day. Could not pick up Abx secondary to cost. Worsening pain that began 1-2 days after drainage. Denies fever, does endorse some chills. No nausea, vomiting. Hx of similar in past. ? ?Review of Systems  ?Positive: Scrotal abscess ?Negative: fever ? ?Physical Exam  ?BP (!) 138/98 (BP Location: Right Arm)   Pulse (!) 112   Temp 99.1 ?F (37.3 ?C) (Oral)   Resp 18   SpO2 94%  ?Gen:   Awake, in severe pain ?Resp:  Normal effort  ?MSK:   Moves extremities without difficulty  ?Other:  Right periscrotal area is fluctuant, tender. No high lying testicle. Intact cremasteric reflex. ? ?Medical Decision Making  ?Medically screening exam initiated at 3:52 PM.  Appropriate orders placed.  Seichi D Deal was informed that the remainder of the evaluation will be completed by another provider, this initial triage assessment does not replace that evaluation, and the importance of remaining in the ED until their evaluation is complete. ? ?Workup initiated ?  ?Olene Floss, PA-C ?02/06/22 1554 ? ?
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
## 2022-02-06 NOTE — Op Note (Signed)
Preoperative diagnosis: Right scrotal abscess ?Postoperative diagnosis: Same ? ?Procedure: Incision and drainage right scrotal abscess ? ?Surgeon: Mena Goes ? ?Anesthesia: General ? ?Indication for procedure: Ryan Lester is a 26 year old male with recurrent scrotal abscess. ? ?Findings: Fluctuant area right hemiscrotum 5 x 4 cm.  This drain foul purulent material.  Tissue was healthy internally but no necrosis.  Scrotum without necrosis. ? ?Description of procedure: After consent was obtained patient brought to the operating room.  After adequate anesthesia he was placed in lithotomy position and prepped and draped in the usual sterile fashion.  Timeout was performed to confirm the patient and procedure.  The skin about the right scrotal abscess was anesthetized with lidocaine/Marcaine with epi injection.  A 4 cm incision was made with a 15 blade over the abscess and felt purulent drainage was expressed.  Aerobic and anaerobic cultures were obtained.  The small wound was copiously irrigated x3 with saline.  Hemostasis was ensured.  Tissues were healthy and beefy red.  The wound was packed with a saline impregnated 4 x 4.  He was awakened and placed supine.  The wound was covered with an ABD and mesh underwear placed.  He was taken to the recovery room in stable condition. ? ?Complications: None ? ?Blood loss: 30 mL ? ?Specimens: Wound culture ? ?Drains: None ? ?Disposition: Patient stable to PACU-I spoke to his wife Ryan Lester and went over the procedure, postop care and follow-up.  She is familiar with wound care and packing. ?

## 2022-02-06 NOTE — ED Notes (Signed)
Dr. Toniann Fail called and states that patient is not getting admitted and he will be discharged from the OR. Spoke with Asher Muir in the OR who verbalized understanding. ?

## 2022-02-06 NOTE — ED Provider Notes (Signed)
?[facility] DEPT ?Provider Note ? ? ?CSN: [id number] ?Arrival date & time: 02/06/22  1512 ? ?  ? ?History ? ?Chief Complaint  ?Patient presents with  ? Abscess  ? ? ?Ryan Lester is a 26 y.o. male. ? ?HPI ? 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  States that this has been incised multiple times in the past.  He was last seen 2 days ago due to his symptoms worsening once again.  At the time was having no fevers or chills.  I&D was performed and he was discharged on doxycycline.  He states that he did not have any funds at the time and was unable to fill the antibiotic.  He states that his pain and swelling returned once again yesterday and have continued to progressively worsen.  Now reports body aches as well as fatigue.  No nausea, vomiting, diarrhea, urinary changes. ? ?  ? ?Home Medications ?Prior to Admission medications   ?Medication Sig Start Date End Date Taking? Authorizing Provider  ?doxycycline (VIBRAMYCIN) 100 MG capsule Take 1 capsule (100 mg total) by mouth 2 (two) times daily. One po bid x 7 days 02/04/22   Deno Etienne, DO  ?ibuprofen (ADVIL) 200 MG tablet Take 400 mg by mouth every 6 (six) hours as needed (for pain).    [provider]  ?naproxen (NAPROSYN) 375 MG tablet Take 1 tablet (375 mg total) by mouth 2 (two) times daily. 11/25/21   Kommor, Madison, MD  ?oxyCODONE-acetaminophen (PERCOCET/ROXICET) 5-325 MG tablet Take 1 tablet by mouth every 6 (six) hours as needed for severe pain. 07/22/21   Couture, Cortni S, PA-C  ?albuterol (PROVENTIL HFA;VENTOLIN HFA) 108 (90 Base) MCG/ACT inhaler Inhale 1-2 puffs into the lungs every 6 (six) hours as needed for wheezing. 02/25/18 04/10/20  Tanna Furry, MD  ?   ? ?Allergies    ?Sulfa antibiotics   ? ?Review of Systems   ?Review of Systems  ?All other systems reviewed and are negative. ?Ten systems reviewed and are negative for acute change, except as  noted in the HPI.   ?Physical Exam ?Updated Vital Signs ?BP 122/74 (BP Location: Left Arm)   Pulse 95   Temp 99.1 ?F (37.3 ?C) (Oral)   Resp 16   SpO2 99%  ?Physical Exam ?Vitals and nursing note reviewed.  ?Constitutional:   ?   General: He is not in acute distress. ?   Appearance: Normal appearance. He is not ill-appearing, toxic-appearing or diaphoretic.  ?HENT:  ?   Head: Normocephalic and atraumatic.  ?   Right Ear: External ear normal.  ?   Left Ear: External ear normal.  ?   Nose: Nose normal.  ?   Mouth/Throat:  ?   Mouth: Mucous membranes are moist.  ?   Pharynx: Oropharynx is clear. No oropharyngeal exudate or posterior oropharyngeal erythema.  ?Eyes:  ?   General: No scleral icterus.    ?   Right eye: No discharge.     ?   Left eye: No discharge.  ?   Extraocular Movements: Extraocular movements intact.  ?   Conjunctiva/sclera: Conjunctivae normal.  ?Cardiovascular:  ?   Rate and Rhythm: Regular rhythm. Tachycardia present.  ?   Pulses: Normal pulses.  ?   Heart sounds: Normal heart sounds. No murmur heard. ?  No friction rub. No gallop.  ?Pulmonary:  ?   Effort: Pulmonary effort is normal. No respiratory distress.  ?  Breath sounds: Normal breath sounds. No stridor. No wheezing, rhonchi or rales.  ?Abdominal:  ?   General: Abdomen is flat.  ?   Palpations: Abdomen is soft.  ?   Tenderness: There is no abdominal tenderness.  ?Genitourinary: ?   Comments: Nursing chaperone present.  5 cm fluctuant exquisitely tender mass noted to the right scrotum.  No discharge noted at the urethral meatus.  Mild overlying erythema. ?Musculoskeletal:     ?   General: Normal range of motion.  ?   Cervical back: Normal range of motion and neck supple. No tenderness.  ?Skin: ?   General: Skin is warm and dry.  ?Neurological:  ?   General: No focal deficit present.  ?   Mental Status: He is alert and oriented to person, place, and time.  ?Psychiatric:     ?   Mood and Affect: Mood normal.     ?   Behavior: Behavior  normal.  ? ?ED Results / Procedures / Treatments   ?Labs ?(all labs ordered are listed, but only abnormal results are displayed) ?Labs Reviewed  ?CBC WITH DIFFERENTIAL/PLATELET - Abnormal; Notable for the following components:  ?    Result Value  ? WBC 13.2 (*)   ? Neutro Abs 10.1 (*)   ? Monocytes Absolute 1.1 (*)   ? All other components within normal limits  ?RESP PANEL BY RT-PCR (FLU A&B, COVID) ARPGX2  ?BASIC METABOLIC PANEL  ? ?EKG ?None ? ?Radiology ?US SCROTUM W/DOPPLER ? ?Result Date: 02/06/2022 ?CLINICAL DATA:  Known abscess concern for epididymitis EXAM: SCROTAL ULTRASOUND DOPPLER ULTRASOUND OF THE TESTICLES TECHNIQUE: Complete ultrasound examination of the testicles, epididymis, and other scrotal structures was performed. Color and spectral Doppler ultrasound were also utilized to evaluate blood flow to the testicles. COMPARISON:  Ultrasound dated November 25, 2021 FINDINGS: Right testicle Measurements: 4.4 x 2.6 x 2.8 cm. No mass or microlithiasis visualized. Heterogeneous fluid collection located lateral to the right testicle with surrounding wall thickening and hyperemia and no definite internal color flow vascularity. Collection is increased in size when compared with prior November 25, 2021 exam Left testicle Measurements: 4.5 x 2.7 x 3.1 cm. No mass or microlithiasis visualized. Right epididymis:  Not visualized. Left epididymis:  Normal in size and appearance. Hydrocele:  None visualized. Varicocele:  None visualized. Pulsed Doppler interrogation of both testes demonstrates normal low resistance arterial and venous waveforms bilaterally. IMPRESSION: 1. Normal appearance of the left epididymis. Right epididymis is not visualized. 2. Heterogeneous fluid collection located lateral to the right testicle, increased in size when compared to prior exam, and compatible with history of abscess. Electronically Signed   By: Yetta Glassman M.D.   On: 02/06/2022 17:12   ? ?Procedures ?Procedures  ? ?Medications  Ordered in ED ?Medications  ?sodium chloride 0.9 % bolus 1,000 mL (has no administration in time range)  ?vancomycin (VANCOREADY) IVPB 2000 mg/400 mL (has no administration in time range)  ?piperacillin-tazobactam (ZOSYN) IVPB 3.375 g (has no administration in time range)  ?oxyCODONE-acetaminophen (PERCOCET/ROXICET) 5-325 MG per tablet 1 tablet (1 tablet Oral Given 02/06/22 1608)  ?oxyCODONE-acetaminophen (PERCOCET/ROXICET) 5-325 MG per tablet 1 tablet (1 tablet Oral Given 02/06/22 1951)  ? ? ?ED Course/ Medical Decision Making/ A&P ?  ? ?                        ?Medical Decision Making ?Risk ?Prescription drug management. ?Decision regarding hospitalization. ? ?Pt is a 26 y.o. male with a  history of a recurrent scrotal abscess who presents to the emergency department with worsening pain and swelling along the scrotum.  Reports associated body aches and fatigue.  Last had an I&D performed 2 days ago and his symptoms began worsening once again yesterday. ? ?Labs: ?CBC with a white count of 13.2, neutrophils of 10.1, monocytes of 1.1. ?BMP within normal limits. ?Respiratory panel is pending. ? ?Imaging: ?Ultrasound of the scrotum shows normal appearance of the left epididymis.  Right epididymis is not visualized.  Heterogeneous fluid collection located lateral to the right testicle increased in size when compared to prior exam and compatible with history of abscess. ? ?I, Rayna Sexton, PA-C, personally reviewed and evaluated these images and lab results as part of my medical decision-making. ? ?On my exam patient has a 5 cm fluctuant exquisitely tender developing abscess along the right hemiscrotum.  No discharge noted at the urethral meatus. ? ?Given patient's recurrent symptoms, white count of 13.2, low-grade temperature, and systemic symptoms, discussed with urology.  Dr. Junious Silk recommends medicine admission.  Keep patient n.p.o.  He will take patient to the operating room tonight for drainage.  Agrees with  starting patient on Zosyn as well as vancomycin. ? ?This plan was discussed with the patient and he is amenable.  We will discuss with the medicine team at this time. ? ?Note: Portions of this report may have been tran

## 2022-02-06 NOTE — Anesthesia Preprocedure Evaluation (Signed)
Anesthesia Evaluation  ?Patient identified by MRN, date of birth, ID band ?Patient awake ? ? ? ?Reviewed: ?Allergy & Precautions, NPO status , Patient's Chart, lab work & pertinent test results ? ?Airway ?Mallampati: III ? ?TM Distance: >3 FB ?Neck ROM: Full ? ? ? Dental ?no notable dental hx. ?(+) Teeth Intact, Dental Advisory Given ?  ?Pulmonary ?neg pulmonary ROS, Current Smoker,  ?  ?Pulmonary exam normal ?breath sounds clear to auscultation ? ? ? ? ? ? Cardiovascular ?negative cardio ROS ?Normal cardiovascular exam ?Rhythm:Regular Rate:Normal ? ? ?  ?Neuro/Psych ?negative neurological ROS ? negative psych ROS  ? GI/Hepatic ?negative GI ROS, Neg liver ROS,   ?Endo/Other  ?Morbid obesityBMI 38 ? Renal/GU ?negative Renal ROS  ?negative genitourinary ?  ?Musculoskeletal ?negative musculoskeletal ROS ?(+)  ? Abdominal ?(+) + obese,   ?Peds ? Hematology ?negative hematology ROS ?(+)   ?Anesthesia Other Findings ?Scrotal abscess ? Reproductive/Obstetrics ?negative OB ROS ? ?  ? ? ? ? ? ? ? ? ? ? ? ? ? ?  ?  ? ? ? ? ? ? ? ? ?Anesthesia Physical ?Anesthesia Plan ? ?ASA: 3 ? ?Anesthesia Plan: General  ? ?Post-op Pain Management: Ofirmev IV (intra-op)*  ? ?Induction: Intravenous ? ?PONV Risk Score and Plan: Ondansetron, Dexamethasone, Midazolam and Treatment may vary due to age or medical condition ? ?Airway Management Planned: LMA ? ?Additional Equipment: None ? ?Intra-op Plan:  ? ?Post-operative Plan: Extubation in OR ? ?Informed Consent: I have reviewed the patients History and Physical, chart, labs and discussed the procedure including the risks, benefits and alternatives for the proposed anesthesia with the patient or authorized representative who has indicated his/her understanding and acceptance.  ? ? ? ?Dental advisory given ? ?Plan Discussed with: CRNA ? ?Anesthesia Plan Comments:   ? ? ? ? ? ? ?Anesthesia Quick Evaluation ? ?

## 2022-02-07 ENCOUNTER — Encounter (HOSPITAL_COMMUNITY): Payer: Self-pay | Admitting: Urology

## 2022-02-13 LAB — AEROBIC/ANAEROBIC CULTURE W GRAM STAIN (SURGICAL/DEEP WOUND): Gram Stain: NONE SEEN

## 2022-06-27 IMAGING — US US SCROTUM W/ DOPPLER COMPLETE
1 series · 13 of 25 positions shown · non-contrast
Comparison: None.

CLINICAL DATA: Question right scrotal abscess.

EXAM:
SCROTAL ULTRASOUND
DOPPLER ULTRASOUND OF THE TESTICLES
TECHNIQUE: Complete ultrasound examination of the testicles, epididymis, and
other scrotal structures was performed. Color and spectral Doppler
ultrasound were also utilized to evaluate blood flow to the
testicles.

[Series 1: us scrotum w/ doppler complete · 120 acquisitions, 13 frames shown]
[im 1/120]
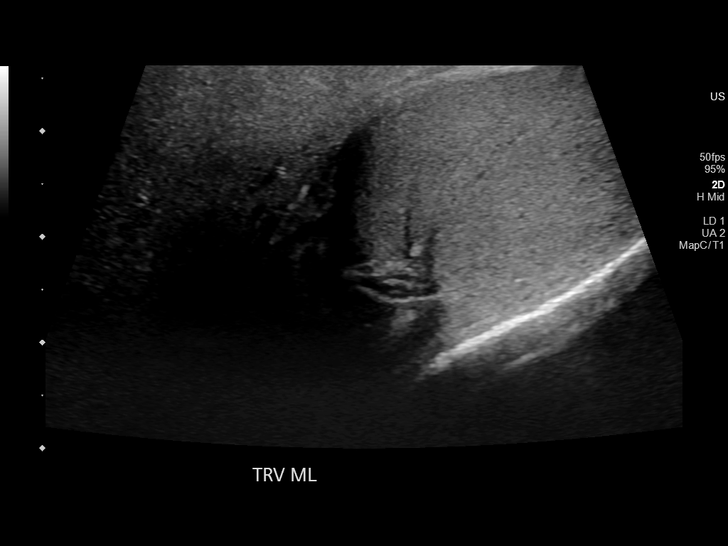
[im 10/120]
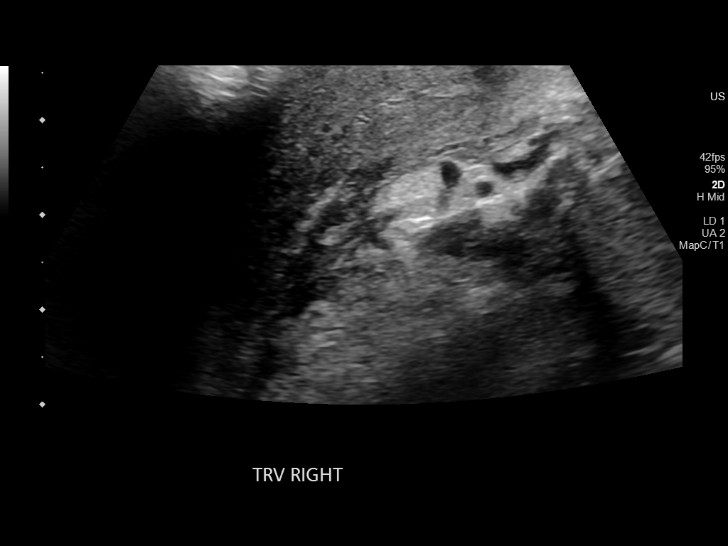
[im 20/120]
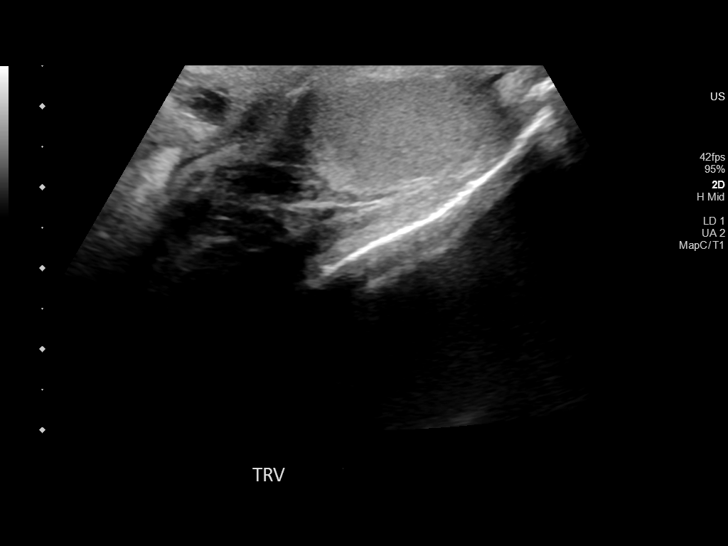
[im 30/120]
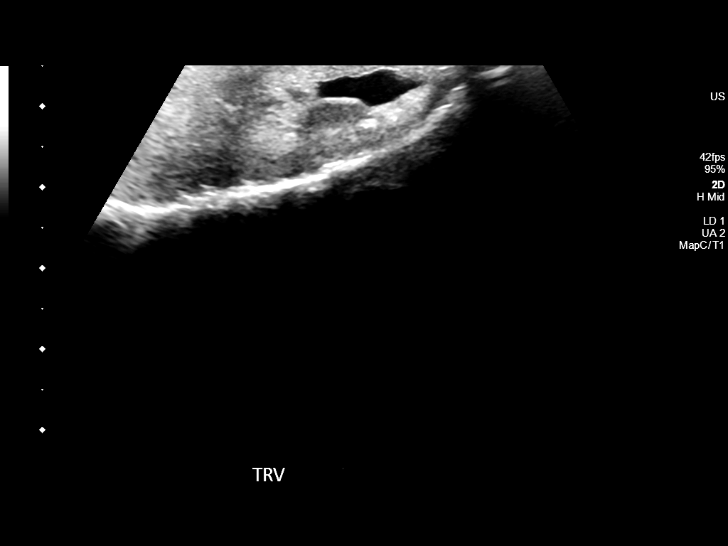
[im 40/120]
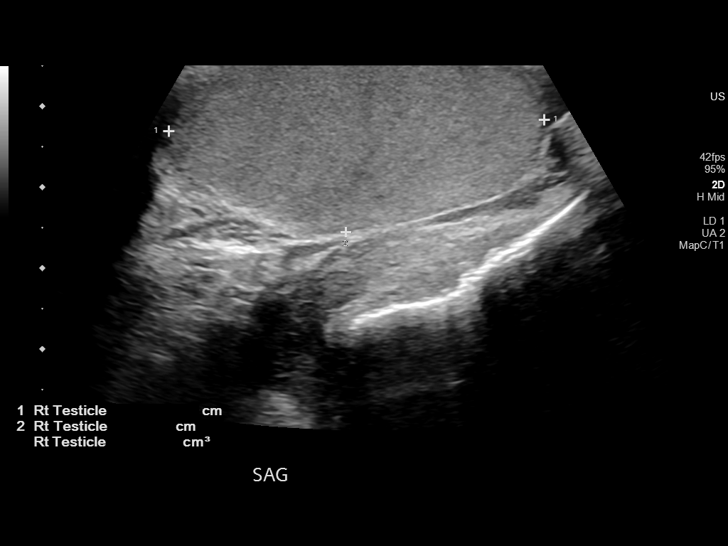
[im 50/120]
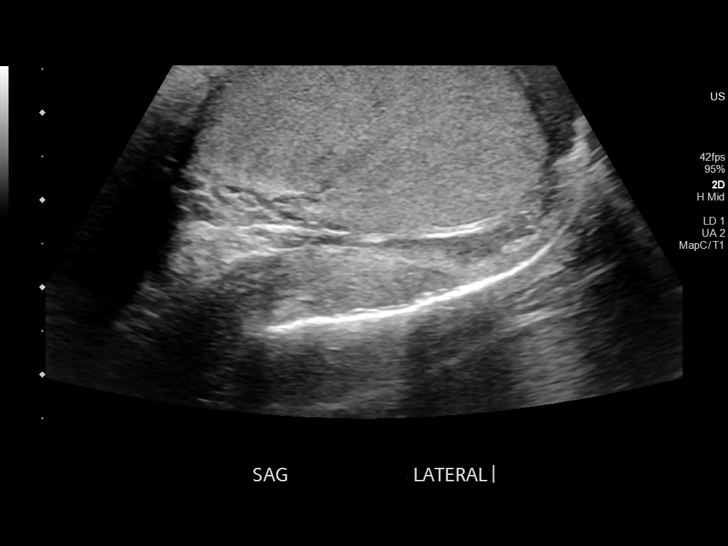
[im 60/120]
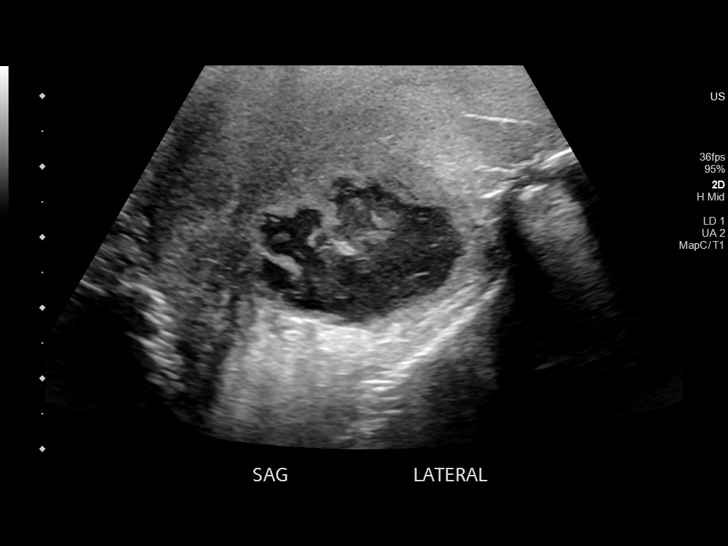
[im 70/120]
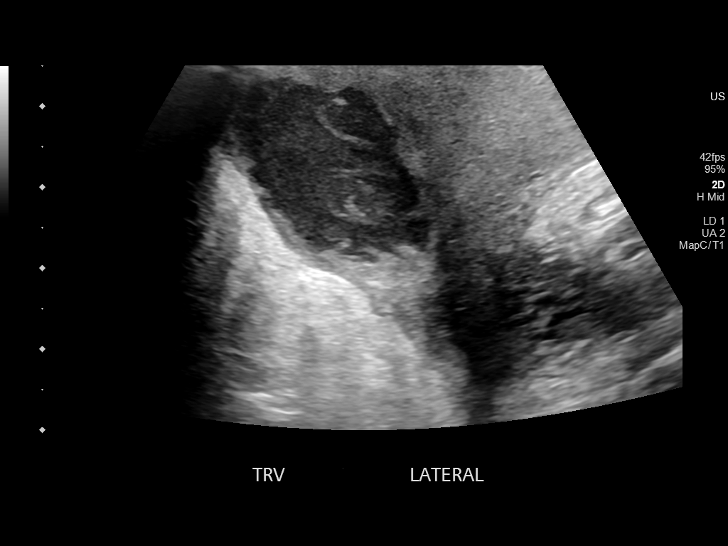
[im 80/120]
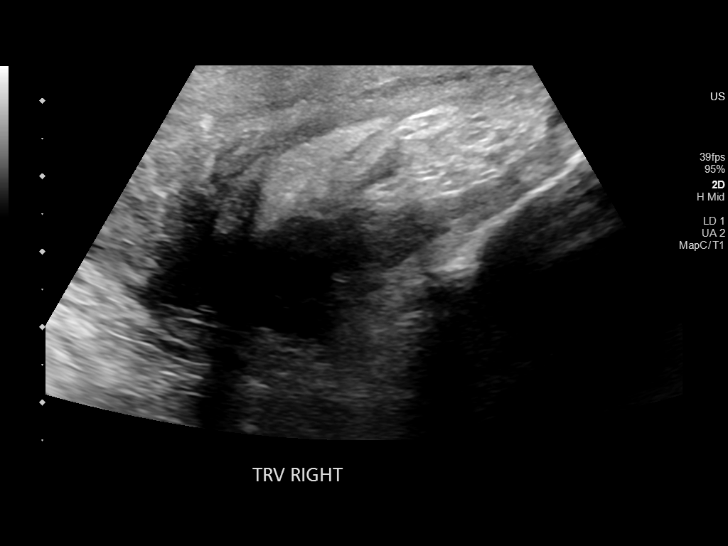
[im 90/120]
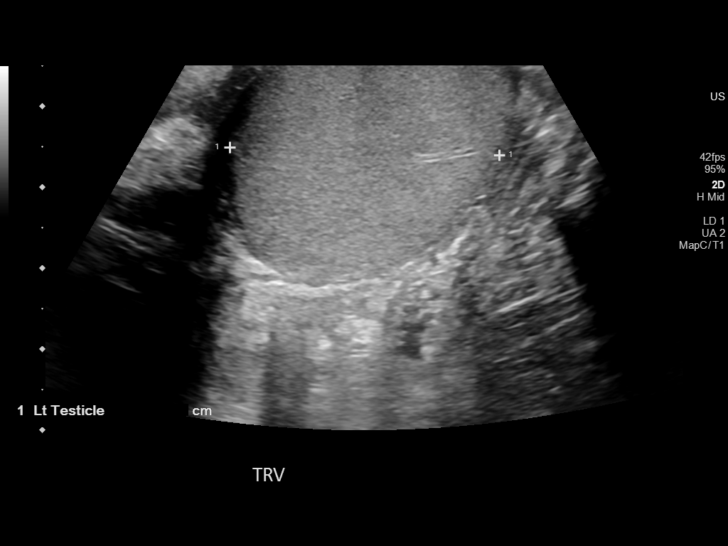
[im 100/120]
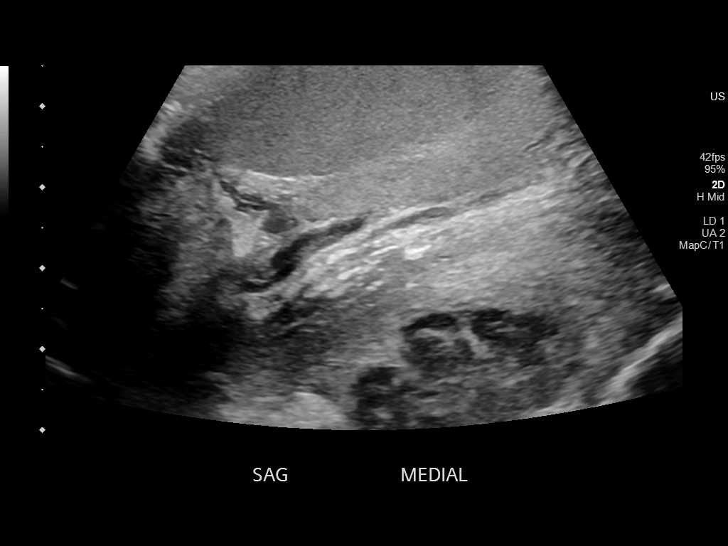
[im 110/120]
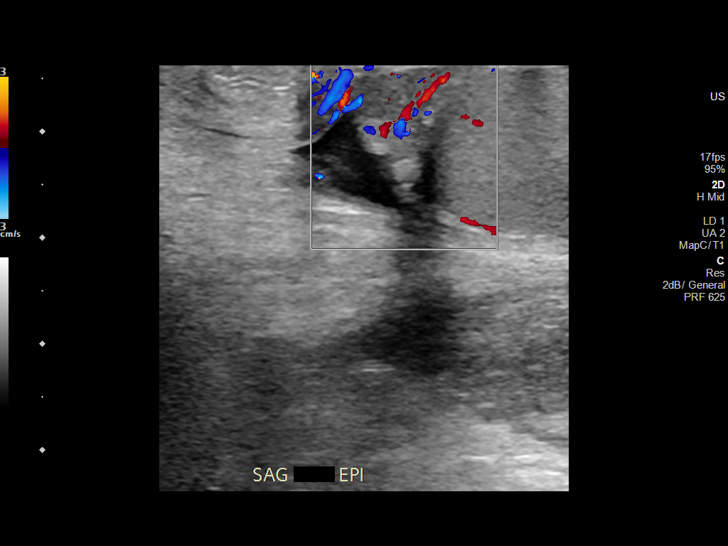
[im 120/120]
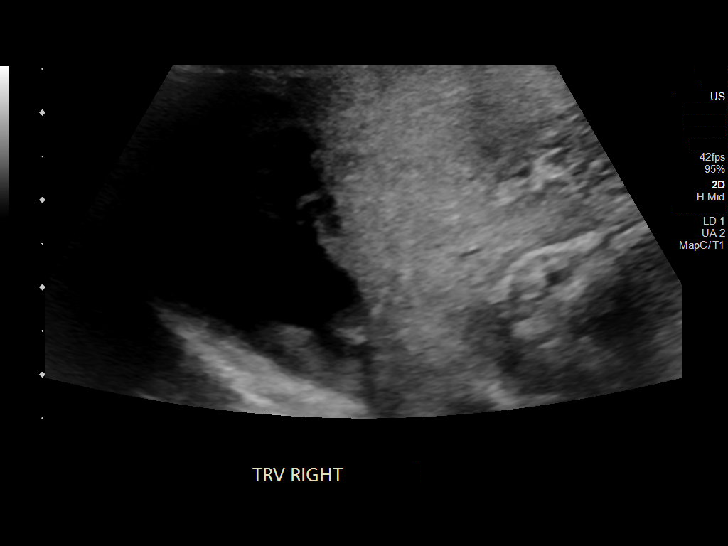

[13 of 25 positions shown; findings below may reference images not displayed]

FINDINGS: Right testicle

Measurements: 4.6 x 2.2 x 3.3. No mass or microlithiasis visualized.

Left testicle

Measurements: 5.4 x 3.0 x 3.3. No mass or microlithiasis visualized.

Right epididymis:  Not well visualized.

Left epididymis: Normal in size and appearance. Small amount of
adjacent free fluid.

Hydrocele:  None visualized.

Varicocele:  Small bilateral varicoceles are present.

Pulsed Doppler interrogation of both testes demonstrates normal low
resistance arterial and venous waveforms bilaterally.

There is marked right scrotal wall thickening and edema. There is a
complex hypoechoic area within the lateral right scrotal wall
measuring 4.0 x 2.6 x 2.4 cm. There is no significant vascularity in
this region.
IMPRESSION: 1. 4.0 x 2.6 x 2.4 cm complex hypoechoic area in the lateral right
scrotal wall. Findings are worrisome for abscess given the clinical
history. There is also right scrotal wall thickening and edema.
2. Small bilateral varicoceles.

## 2022-06-28 IMAGING — US US SCROTUM W/ DOPPLER COMPLETE
1 series · 15 of 25 positions shown · non-contrast
Comparison: 07/22/2021

CLINICAL DATA: Testicle pain and swelling

EXAM:
SCROTAL ULTRASOUND
DOPPLER ULTRASOUND OF THE TESTICLES
TECHNIQUE: Complete ultrasound examination of the testicles, epididymis, and
other scrotal structures was performed. Color and spectral Doppler
ultrasound were also utilized to evaluate blood flow to the
testicles.

[Series 1: us art/ven flow abd pelv doppl mc & wl · 15 of 35 slices shown]
[im 1/35]
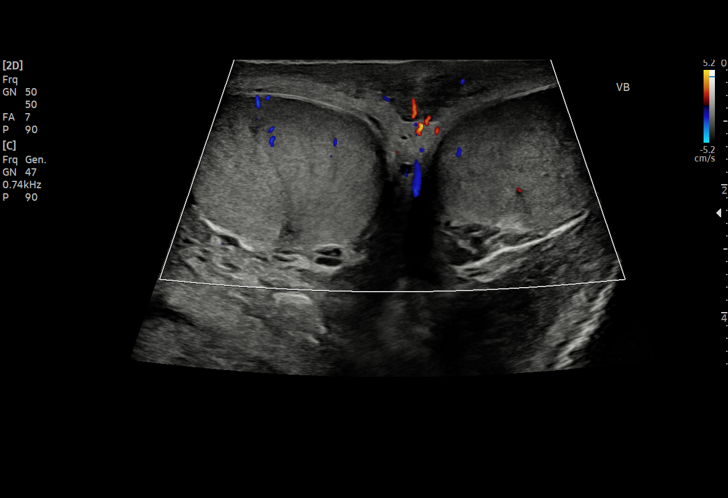
[im 3/35]
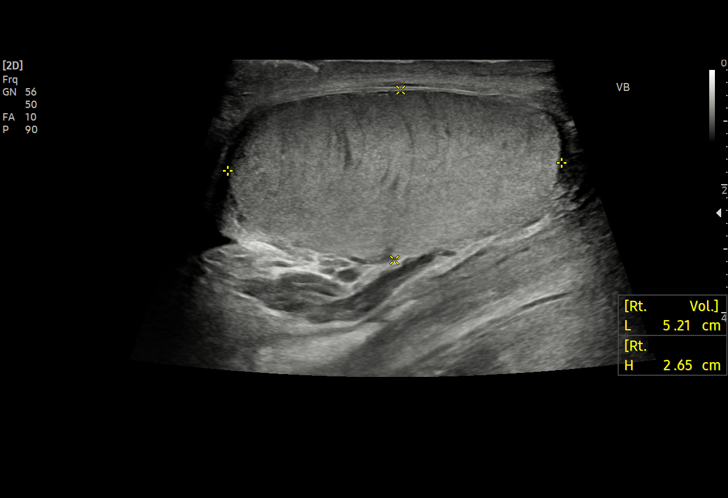
[im 6/35]
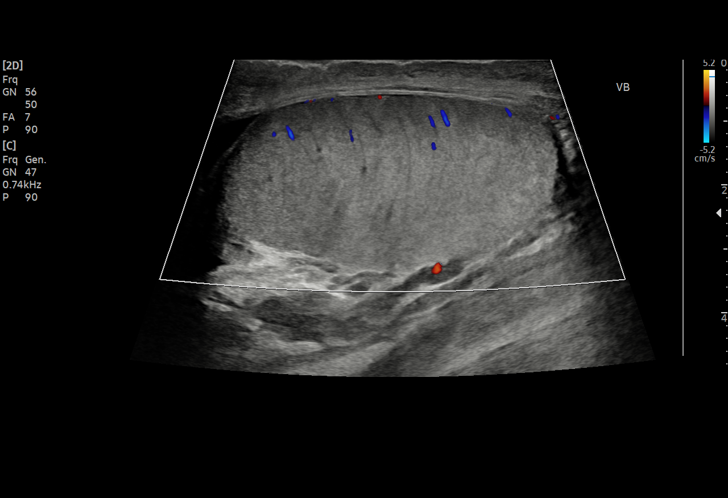
[im 8/35]
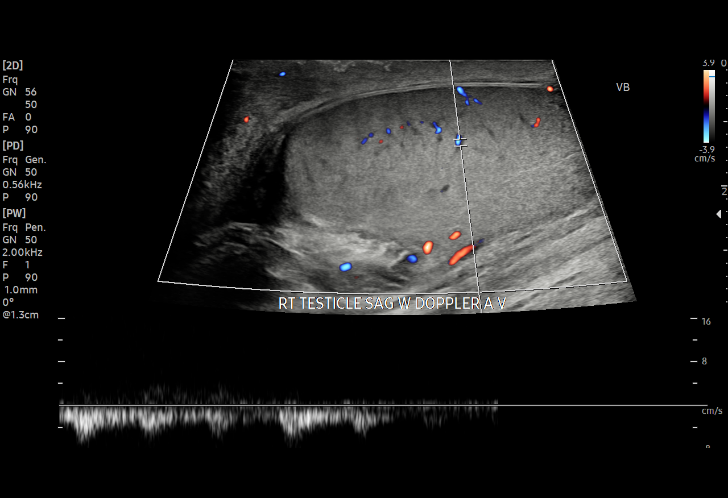
[im 10/35]
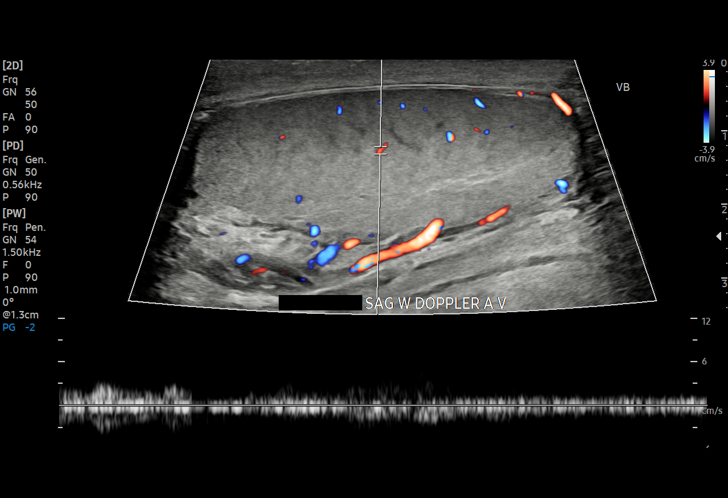
[im 13/35]
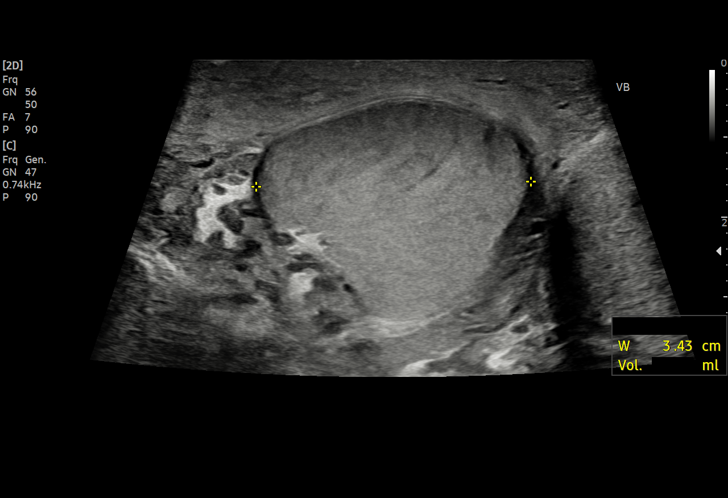
[im 15/35]
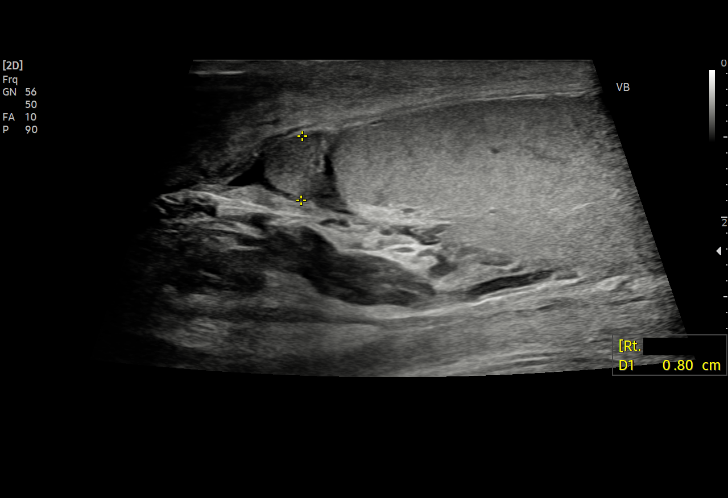
[im 18/35]
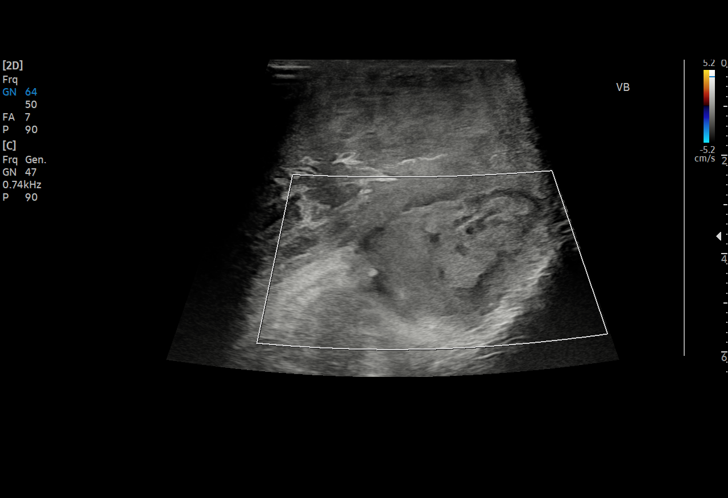
[im 20/35]
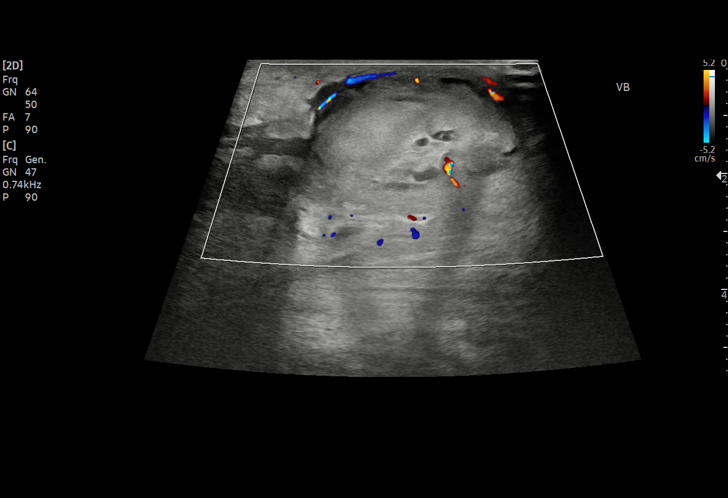
[im 22/35]
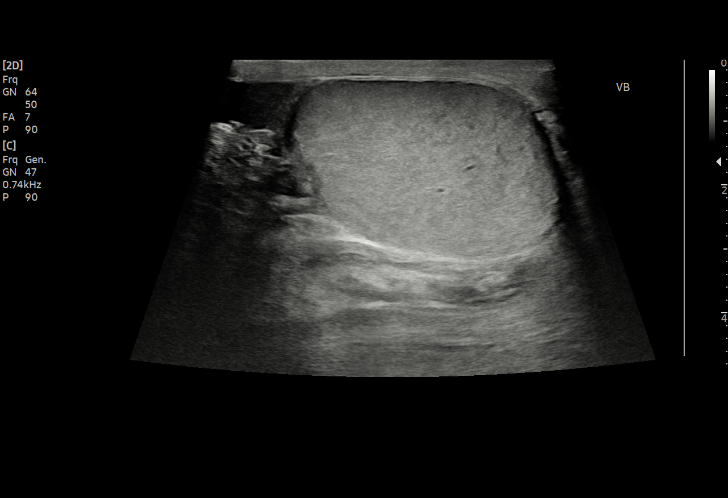
[im 25/35]
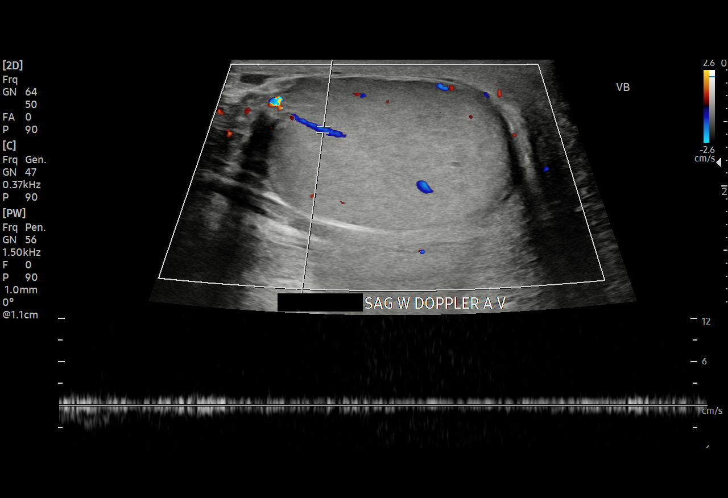
[im 27/35]
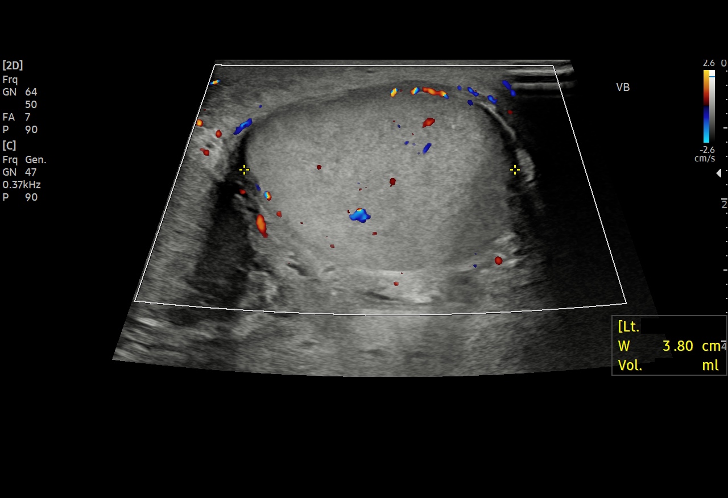
[im 29/35]
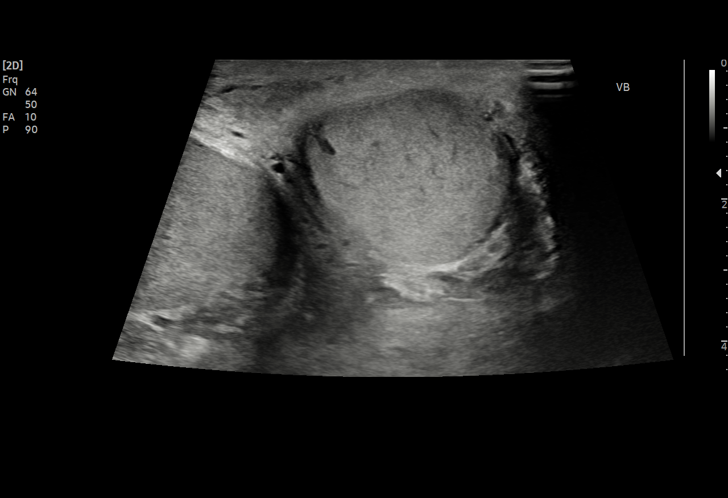
[im 32/35]
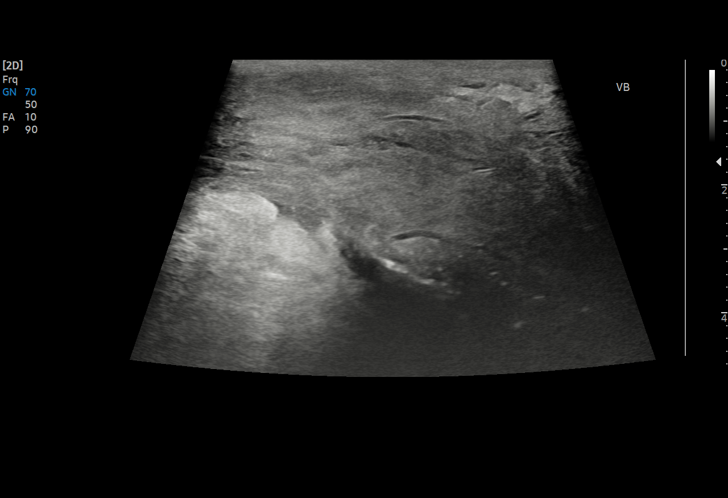
[im 35/35]
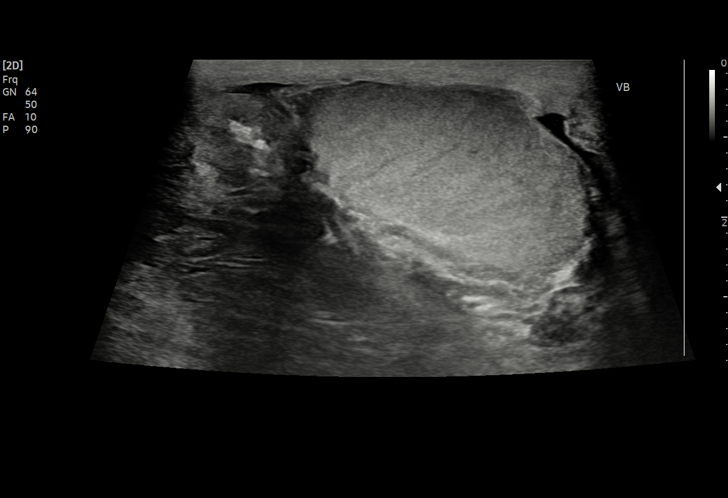

[15 of 25 positions shown; findings below may reference images not displayed]

FINDINGS: Right testicle

Measurements: 5.2 x 2.7 x 3.4 cm. No mass or microlithiasis
visualized. Marked scrotal wall edema. Complex fluid collection at
the right scrotal wall is not identified today. Now seen lateral to
the right testis is a heterogenous echogenic area measuring
approximately 4 x 4 cm (my measurement)

Left testicle

Measurements: 4.5 x 2.5 x 3.8 cm. No mass or microlithiasis
visualized.

Right epididymis:  Normal in size and appearance.

Left epididymis:  Normal in size and appearance.

Hydrocele:  None visualized.

Varicocele:  Not demonstrated on this exam

Pulsed Doppler interrogation of both testes demonstrates normal low
resistance arterial and venous waveforms bilaterally.
IMPRESSION: 1. Marked scrotal wall thickening and edema on the right.
Heterogenous avascular echogenic area lateral to the right testis
measuring up to 4 cm, different in appearance than the heterogenous
complex fluid collection seen on the ultrasound from yesterday. This
could represent a hematoma given history of recent intervention;
short interval sonographic follow-up is recommended.
2. No definitive evidence for torsion

## 2022-09-14 ENCOUNTER — Other Ambulatory Visit: Payer: Self-pay

## 2022-09-14 ENCOUNTER — Encounter (HOSPITAL_COMMUNITY): Payer: Self-pay | Admitting: Emergency Medicine

## 2022-09-14 ENCOUNTER — Emergency Department (HOSPITAL_COMMUNITY)
Admission: EM | Admit: 2022-09-14 | Discharge: 2022-09-14 | Disposition: A | Discharge status: Home / Self Care | Payer: Commercial Managed Care - PPO | Attending: Emergency Medicine | Admitting: Emergency Medicine

## 2022-09-14 DIAGNOSIS — L0231 Cutaneous abscess of buttock: Secondary | ICD-10-CM | POA: Diagnosis not present

## 2022-09-14 DIAGNOSIS — D72829 Elevated white blood cell count, unspecified: Secondary | ICD-10-CM | POA: Diagnosis not present

## 2022-09-14 DIAGNOSIS — L0291 Cutaneous abscess, unspecified: Secondary | ICD-10-CM

## 2022-09-14 LAB — BASIC METABOLIC PANEL
Anion gap: 7 (ref 5–15)
BUN: 10 mg/dL (ref 6–20)
CO2: 27 mmol/L (ref 22–32)
Calcium: 9.3 mg/dL (ref 8.9–10.3)
Chloride: 104 mmol/L (ref 98–111)
Creatinine, Ser: 1.26 mg/dL — ABNORMAL HIGH (ref 0.61–1.24)
GFR, Estimated: >60 mL/min (ref >60)
Glucose, Bld: 104 mg/dL — ABNORMAL HIGH (ref 70–99)
Potassium: 4.1 mmol/L (ref 3.5–5.1)
Sodium: 138 mmol/L (ref 135–145)

## 2022-09-14 LAB — CBC WITH DIFFERENTIAL/PLATELET
Abs Immature Granulocytes: 0.05 10*3/uL (ref 0.00–0.07)
Basophils Absolute: 0.1 10*3/uL (ref 0.0–0.1)
Basophils Relative: 1 %
Eosinophils Absolute: 0.2 10*3/uL (ref 0.0–0.5)
Eosinophils Relative: 2 %
HCT: 44.9 % (ref 39.0–52.0)
Hemoglobin: 14.2 g/dL (ref 13.0–17.0)
Immature Granulocytes: 0 %
Lymphocytes Relative: 18 %
Lymphs Abs: 2.4 10*3/uL (ref 0.7–4.0)
MCH: 29 pg (ref 26.0–34.0)
MCHC: 31.6 g/dL (ref 30.0–36.0)
MCV: 91.8 fL (ref 80.0–100.0)
Monocytes Absolute: 1.2 10*3/uL — ABNORMAL HIGH (ref 0.1–1.0)
Monocytes Relative: 9 %
Neutro Abs: 9.6 10*3/uL — ABNORMAL HIGH (ref 1.7–7.7)
Neutrophils Relative %: 70 %
Platelets: 308 10*3/uL (ref 150–400)
RBC: 4.89 MIL/uL (ref 4.22–5.81)
RDW: 13.2 % (ref 11.5–15.5)
WBC: 13.6 10*3/uL — ABNORMAL HIGH (ref 4.0–10.5)
nRBC: 0 % (ref 0.0–0.2)

## 2022-09-14 MED ORDER — HYDROMORPHONE HCL 1 MG/ML IJ SOLN
0.5000 mg | Freq: Once | INTRAMUSCULAR | Status: AC
  Administered 2022-09-14: 0.5 mg via INTRAVENOUS
  Filled 2022-09-14: qty 1

## 2022-09-14 MED ORDER — CLINDAMYCIN HCL 300 MG PO CAPS: 300.0000 mg | ORAL_CAPSULE | Freq: Three times a day (TID) | ORAL | 0 refills | Status: AC

## 2022-09-14 MED ORDER — LIDOCAINE HCL (PF) 1 % IJ SOLN
20.0000 mL | Freq: Once | INTRAMUSCULAR | Status: AC
  Administered 2022-09-14: 20 mL via INTRADERMAL
  Filled 2022-09-14: qty 30

## 2022-09-14 NOTE — Discharge Instructions (Addendum)
Please take antibiotics as precribed. Please take tylenol/ibuprofen for pain. I recommend close follow-up with general surgery for reevaluation.  Please do not hesitate to return to emergency department if worrisome signs symptoms we discussed become apparent.

## 2022-09-14 NOTE — ED Provider Notes (Signed)
[facility] DEPT Provider Note   CSN: [id number] Arrival date & time: 09/14/22  1137     History {Add pertinent medical, surgical, social history, OB history to HPI:1} Chief Complaint  Patient presents with  . Abscess    Mac D Cygan is a 26 y.o. male with no significant past medical history presenting to the emergency department for evaluation of abscess.  Patient states he noticed an abscess on his right buttock on Tuesday.  Since then the abscess has increased in size.  The abscess is painful to touch.  Denies seeing any drainage.  Denies fever.  States he has had a few abscess in different locations in the past.  History of diabetes.   Abscess      Home Medications Prior to Admission medications   Medication Sig Start Date End Date Taking? Authorizing Provider  doxycycline (VIBRAMYCIN) 100 MG capsule Take 1 capsule (100 mg total) by mouth 2 (two) times daily. One po bid x 7 days Patient not taking: Reported on 02/06/2022 02/04/22   Deno Etienne, DO  ibuprofen (ADVIL) 200 MG tablet Take 800 mg by mouth every 6 (six) hours as needed for moderate pain (for pain).    [provider]  oxyCODONE-acetaminophen (PERCOCET/ROXICET) 5-325 MG tablet Take 1 tablet by mouth every 6 (six) hours as needed for severe pain. Patient not taking: Reported on 02/06/2022 07/22/21   Couture, Cortni S, PA-C  albuterol (PROVENTIL HFA;VENTOLIN HFA) 108 (90 Base) MCG/ACT inhaler Inhale 1-2 puffs into the lungs every 6 (six) hours as needed for wheezing. 02/25/18 04/10/20  Tanna Furry, MD      Allergies    Sulfa antibiotics    Review of Systems   Review of Systems  Skin:        Abscess.    Physical Exam Updated Vital Signs BP (!) 137/94 (BP Location: Right Arm)   Pulse (!) 111   Temp 99.6 F (37.6 C) (Oral)   Resp 16   SpO2 96%  Physical Exam Vitals and nursing note reviewed.  Constitutional:      Appearance: Normal appearance.  HENT:     Head:  Normocephalic and atraumatic.     Mouth/Throat:     Mouth: Mucous membranes are moist.  Eyes:     General: No scleral icterus. Cardiovascular:     Rate and Rhythm: Normal rate and regular rhythm.     Pulses: Normal pulses.     Heart sounds: Normal heart sounds.  Pulmonary:     Effort: Pulmonary effort is normal.     Breath sounds: Normal breath sounds.  Abdominal:     General: Abdomen is flat.     Palpations: Abdomen is soft.     Tenderness: There is no abdominal tenderness.  Musculoskeletal:        General: No deformity.  Skin:    General: Skin is warm.     Findings: No rash.     Comments: 7 x 5 abscess under right buttock with no blood or fluid drainage.  Neurological:     General: No focal deficit present.     Mental Status: He is alert.  Psychiatric:        Mood and Affect: Mood normal.     ED Results / Procedures / Treatments   Labs (all labs ordered are listed, but only abnormal results are displayed) Labs Reviewed - No data to display  EKG None  Radiology No results found.  Procedures Procedures  {Document cardiac monitor, telemetry  assessment procedure when appropriate:1}  Medications Ordered in ED Medications - No data to display  ED Course/ Medical Decision Making/ A&P                           Medical Decision Making  This patient presents to the ED for concern of ***, this involves an extensive number of treatment options, and is a complaint that carries with it a high risk of complications and morbidity.  The differential diagnosis includes *** Co morbidities that complicate the patient evaluation  See HPI Additional history obtained:  Additional history obtained from EMR External records from outside source obtained and reviewed  Lab Tests:  I Ordered, and personally interpreted labs.  The pertinent results include:   No leukocytosis noted.   No evidence of anemia.   Platelets within normal range.   No electrolyte abnormalities noted.    Renal function within normal limits.   No transaminitis noted.  UA significant for no acute abnormalities. *** Imaging Studies ordered:  I ordered imaging studies including: ***  I independently visualized and interpreted imaging. I agree with the radiologist interpretation Cardiac Monitoring: / EKG:  The patient was maintained on a cardiac monitor.  I personally viewed and interpreted the cardiac monitored which showed an underlying rhythm of: sinus rhythm Consultations Obtained:  I requested consultation with the ***,  and discussed lab and imaging findings as well as pertinent plan - they recommend: *** Problem List / ED Course / Critical interventions / Medication management  HPI: see above Vitals signs within normal range and stable throughout visit. Laboratory/imaging studies significant for: See above On physical examination, patient is afebrile and appears in no acute distress. Patient's presentations are most concerned for ***. Low suspicion for ***. I ordered medication including ***  Reevaluation of the patient after these medicines showed that the patient {resolved/improved/worsened:23923::"improved"} I have reviewed the patients home medicines and have made adjustments as needed Social Determinants of Health:  N/A Test / Admission / Dispo - Considered:  Continued outpatient therapy. Follow-up with PCP *** recommended for reevaluation of symptoms. Treatment plan discussed with patient.  Pt acknowledged understanding was agreeable to the plan. Worrisome signs and symptoms were discussed with patient, and patient acknowledged understanding to return to the ED if they noticed these signs and symptoms. Patient was stable upon discharge.    {Document critical care time when appropriate:1} {Document review of labs and clinical decision tools ie heart score, Chads2Vasc2 etc:1}  {Document your independent review of radiology images, and any outside records:1} {Document your  discussion with family members, caretakers, and with consultants:1} {Document social determinants of health affecting pt's care:1} {Document your decision making why or why not admission, treatments were needed:1} Final Clinical Impression(s) / ED Diagnoses Final diagnoses:  None    Rx / DC Orders ED Discharge Orders     None

## 2022-09-14 NOTE — ED Triage Notes (Signed)
Pt reports abscess on bottom that came up Wednesday.

## 2022-10-31 IMAGING — US US SCROTUM
1 series · 13 of 25 positions shown · non-contrast
Comparison: Scrotal ultrasound 07/23/2021, 07/22/2021

EXAM:
SCROTAL ULTRASOUND

DOPPLER ULTRASOUND OF THE TESTICLES
TECHNIQUE: Complete ultrasound examination of the testicles, epididymis, and
other scrotal structures was performed. Color and spectral Doppler
ultrasound were also utilized to evaluate blood flow to the
testicles.

[Series 1: us scrotum · 68 acquisitions, 13 frames shown]
[im 1/68]
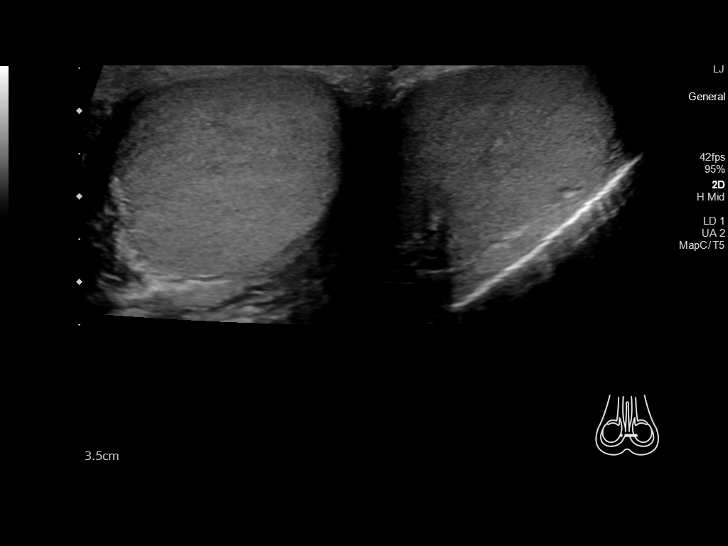
[im 6/68]
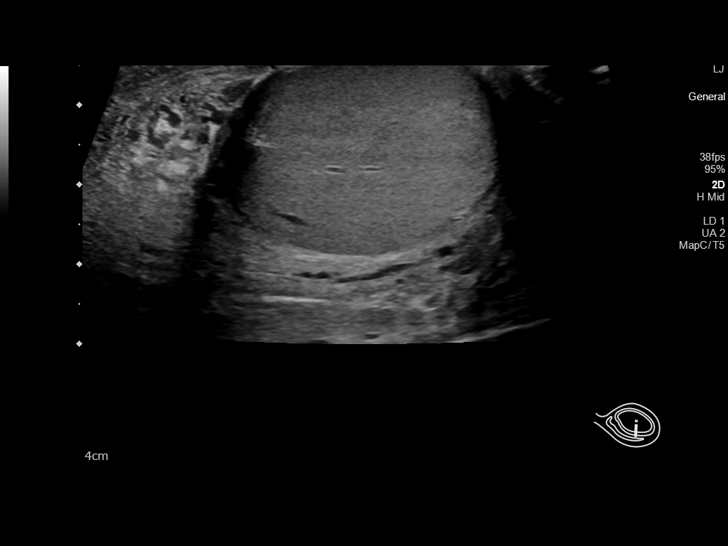
[im 12/68]
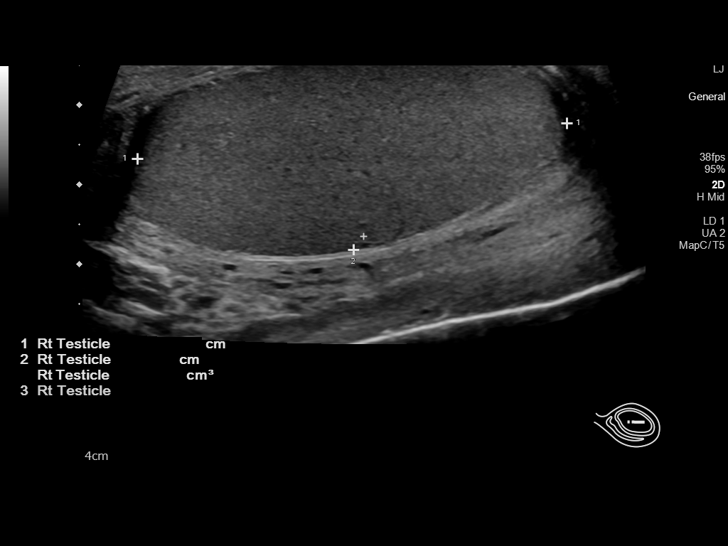
[im 17/68]
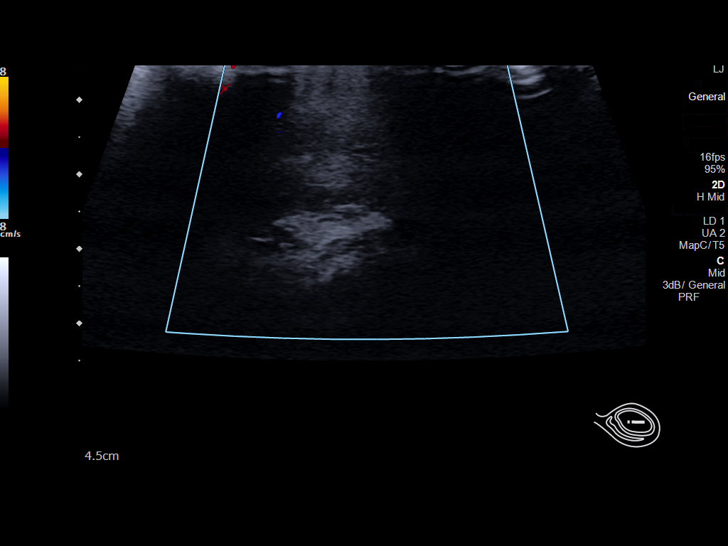
[im 23/68]
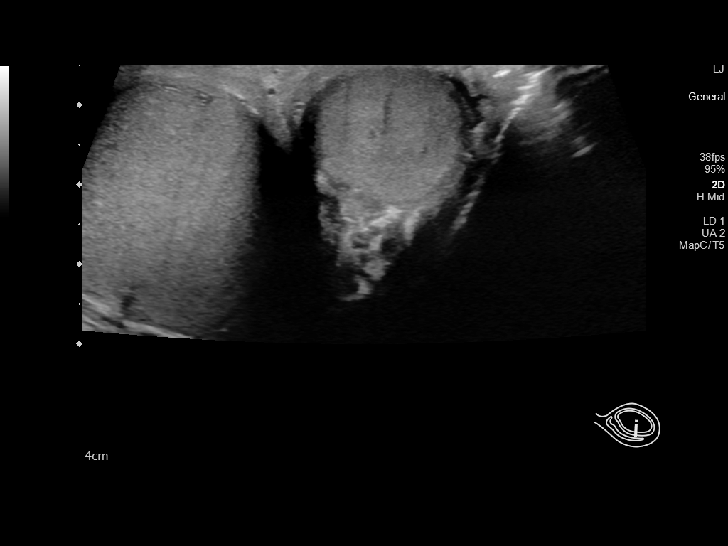
[im 28/68]
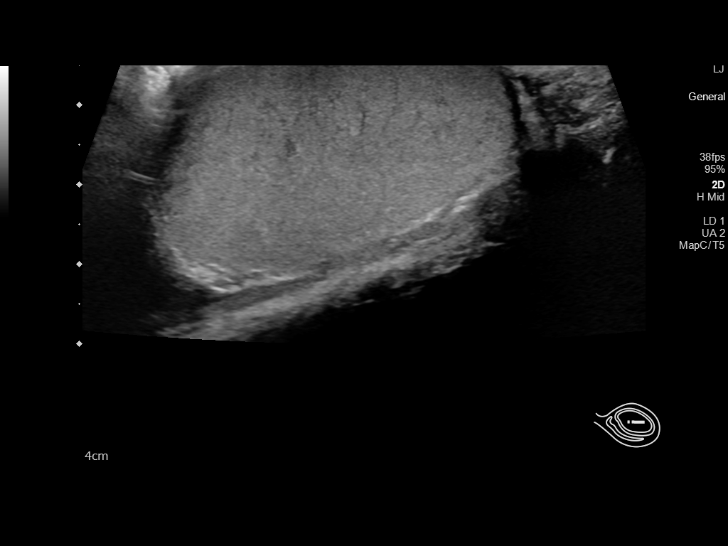
[im 34/68]
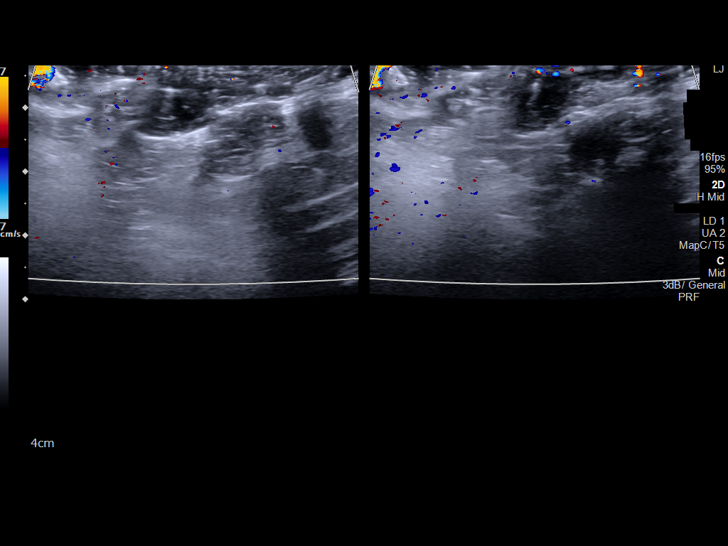
[im 40/68]
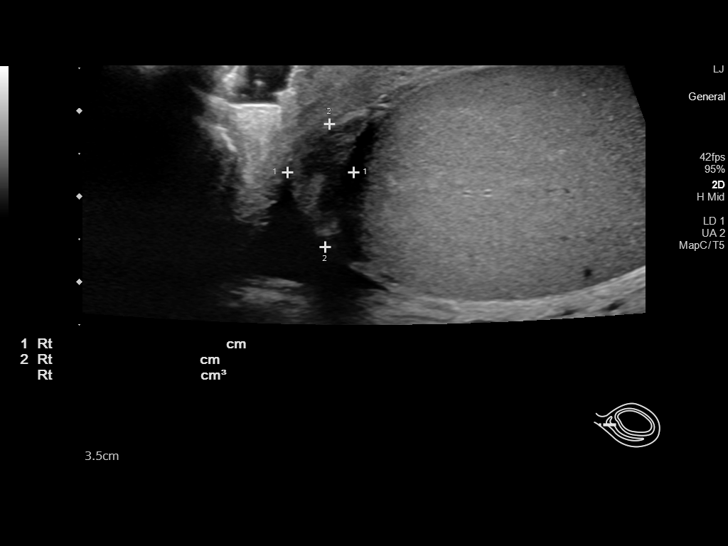
[im 45/68]
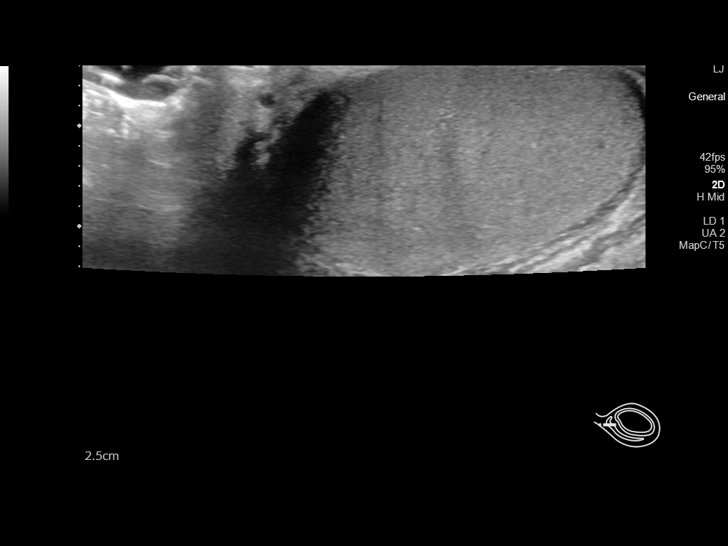
[im 51/68]
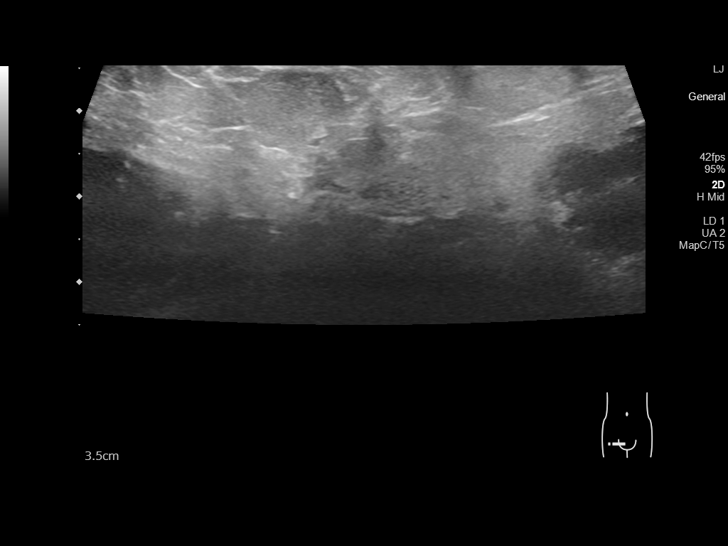
[im 56/68]
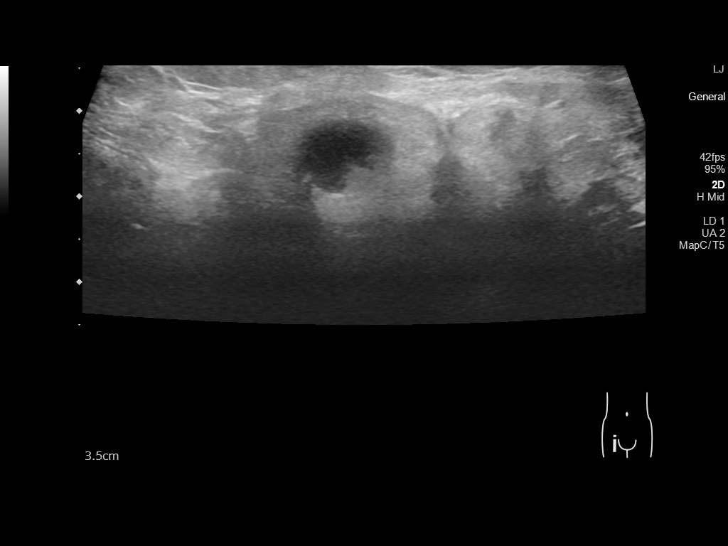
[im 62/68]
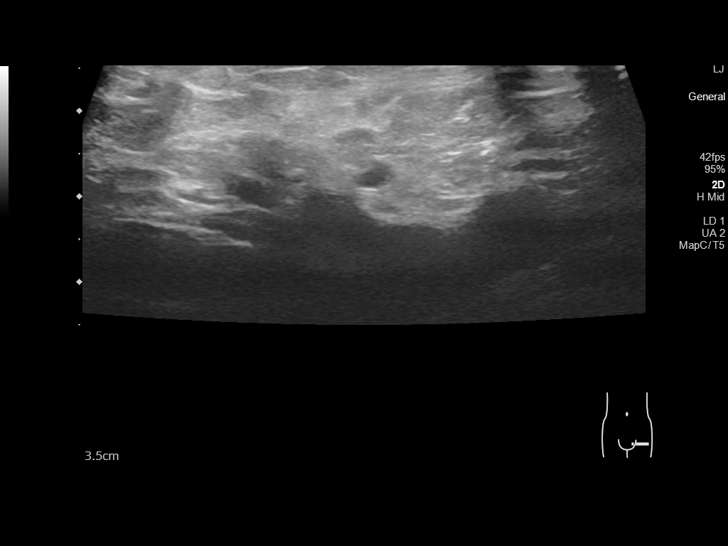
[im 68/68]
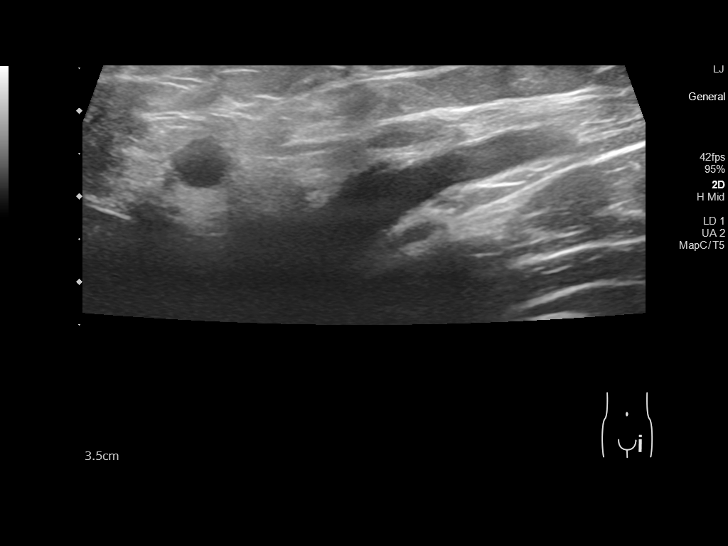

[13 of 25 positions shown; findings below may reference images not displayed]

FINDINGS: Right testicle

Measurements: 5.4 x 2.4 x 3.3 cm. No mass or microlithiasis
visualized.

There is a heterogeneous region lateral to the right testicle
measuring 3.7 x 2.8 x 2.6 cm. No definite internal color flow
vascularity but mild increased vascularity within scrotal soft
tissues peripheral to this region.

Left testicle

Measurements: 4.5 x 2.7 x 2.9 cm. No mass or microlithiasis
visualized.

Right epididymis:  Normal in size and appearance.

Left epididymis:  Normal in size and appearance.

Hydrocele:  None visualized.

Varicocele:  None visualized.

Pulsed Doppler interrogation of both testes demonstrates normal low
resistance arterial and venous waveforms bilaterally.

Within the right inguinal canal there is multiple mildly prominent
lymph nodes. The largest measures up to 9 mm in short axis with
cortical thickness measuring approximally 6.5 mm. It retains a
normal fatty hilum. This may be reactive. Normal appearing left
inguinal lymph nodes are noted.
IMPRESSION: :
IMPRESSION: 1. Compared to 07/23/2021 ultrasound performed after interval
drainage of a suspected abscess seen on 07/22/2021 ultrasound, there
is again a heterogeneous collection lateral to the right testicle
without definite internal color flow vascularity. This is again
compatible with residual phlegmon/chronic abscess. This appears
slightly smaller compared to the prior heterogeneous collection
measuring up to 5.2 cm on 07/23/2021. Due to the extra-articular
testicular location, a malignancy is felt unlikely. Recommend
continued clinical and sonographic follow-up.
2. There are mildly prominent but not pathologically enlarged by
radiologic criteria lymph nodes within the right inguinal canal.
These may be reactive.

## 2023-02-07 ENCOUNTER — Other Ambulatory Visit: Payer: Self-pay

## 2023-02-07 ENCOUNTER — Emergency Department (HOSPITAL_COMMUNITY): Payer: Self-pay

## 2023-02-07 ENCOUNTER — Emergency Department (HOSPITAL_COMMUNITY)
Admission: EM | Admit: 2023-02-07 | Discharge: 2023-02-07 | Disposition: A | Discharge status: Home / Self Care | Payer: Self-pay | Attending: Emergency Medicine | Admitting: Emergency Medicine

## 2023-02-07 DIAGNOSIS — N492 Inflammatory disorders of scrotum: Secondary | ICD-10-CM | POA: Insufficient documentation

## 2023-02-07 LAB — BASIC METABOLIC PANEL
Anion gap: 6 (ref 5–15)
BUN: 9 mg/dL (ref 6–20)
CO2: 24 mmol/L (ref 22–32)
Calcium: 9 mg/dL (ref 8.9–10.3)
Chloride: 106 mmol/L (ref 98–111)
Creatinine, Ser: 1.07 mg/dL (ref 0.61–1.24)
GFR, Estimated: >60 mL/min (ref >60)
Glucose, Bld: 86 mg/dL (ref 70–99)
Potassium: 3.9 mmol/L (ref 3.5–5.1)
Sodium: 136 mmol/L (ref 135–145)

## 2023-02-07 LAB — CBC WITH DIFFERENTIAL/PLATELET
Abs Immature Granulocytes: 0.03 10*3/uL (ref 0.00–0.07)
Basophils Absolute: 0.1 10*3/uL (ref 0.0–0.1)
Basophils Relative: 1 %
Eosinophils Absolute: 0.2 10*3/uL (ref 0.0–0.5)
Eosinophils Relative: 1 %
HCT: 45 % (ref 39.0–52.0)
Hemoglobin: 14.5 g/dL (ref 13.0–17.0)
Immature Granulocytes: 0 %
Lymphocytes Relative: 20 %
Lymphs Abs: 2.2 10*3/uL (ref 0.7–4.0)
MCH: 29.7 pg (ref 26.0–34.0)
MCHC: 32.2 g/dL (ref 30.0–36.0)
MCV: 92 fL (ref 80.0–100.0)
Monocytes Absolute: 1.2 10*3/uL — ABNORMAL HIGH (ref 0.1–1.0)
Monocytes Relative: 11 %
Neutro Abs: 7.6 10*3/uL (ref 1.7–7.7)
Neutrophils Relative %: 67 %
Platelets: 243 10*3/uL (ref 150–400)
RBC: 4.89 MIL/uL (ref 4.22–5.81)
RDW: 14 % (ref 11.5–15.5)
WBC: 11.2 10*3/uL — ABNORMAL HIGH (ref 4.0–10.5)
nRBC: 0 % (ref 0.0–0.2)

## 2023-02-07 LAB — URINALYSIS, ROUTINE W REFLEX MICROSCOPIC
Bilirubin Urine: NEGATIVE
Glucose, UA: NEGATIVE mg/dL
Ketones, ur: NEGATIVE mg/dL
Leukocytes,Ua: NEGATIVE
Nitrite: NEGATIVE
Protein, ur: NEGATIVE mg/dL
Specific Gravity, Urine: 1.019 (ref 1.005–1.030)
pH: 5 (ref 5.0–8.0)

## 2023-02-07 MED ORDER — LACTATED RINGERS IV SOLN
INTRAVENOUS | Status: DC
Start: 2023-02-07 — End: 2023-02-07

## 2023-02-07 MED ORDER — OXYCODONE-ACETAMINOPHEN 5-325 MG PO TABS: 1.0000 | ORAL_TABLET | Freq: Four times a day (QID) | ORAL | 0 refills | Status: DC | PRN

## 2023-02-07 MED ORDER — DOXYCYCLINE HYCLATE 100 MG PO CAPS: 100.0000 mg | ORAL_CAPSULE | Freq: Two times a day (BID) | ORAL | 0 refills | Status: DC

## 2023-02-07 MED ORDER — OXYCODONE-ACETAMINOPHEN 5-325 MG PO TABS
2.0000 | ORAL_TABLET | Freq: Once | ORAL | Status: AC
  Administered 2023-02-07: 2 via ORAL
  Filled 2023-02-07: qty 2

## 2023-02-07 MED ORDER — HYDROMORPHONE HCL 1 MG/ML IJ SOLN
1.0000 mg | Freq: Once | INTRAMUSCULAR | Status: AC
  Administered 2023-02-07: 1 mg via INTRAVENOUS
  Filled 2023-02-07: qty 1

## 2023-02-07 NOTE — ED Triage Notes (Signed)
Pt reports abscess in groin area states he had to have surgery for same last year.

## 2023-02-07 NOTE — ED Provider Notes (Signed)
[place] EMERGENCY DEPARTMENT AT [facility] Provider Note   CSN: [id number] Arrival date & time: 02/07/23  1454     History  Chief Complaint  Patient presents with   Abscess    Ryan Lester is a 27 y.o. male.  27 year old male presents with right-sided scrotal pain times several days.  History of scrotal abscess and this feels similar.  Last time he had this which was last year he required operative drainage.  Denies any fever or chills.  No dysuria.  Pain is worse with any movement and characterized as sharp.  Denies any penile drainage or discharge.  No treatment use prior to arrival       Home Medications Prior to Admission medications   Medication Sig Start Date End Date Taking? Authorizing Provider  doxycycline (VIBRAMYCIN) 100 MG capsule Take 1 capsule (100 mg total) by mouth 2 (two) times daily. One po bid x 7 days Patient not taking: Reported on 02/06/2022 02/04/22   Deno Etienne, DO  ibuprofen (ADVIL) 200 MG tablet Take 800 mg by mouth every 6 (six) hours as needed for moderate pain (for pain).    [provider]  oxyCODONE-acetaminophen (PERCOCET/ROXICET) 5-325 MG tablet Take 1 tablet by mouth every 6 (six) hours as needed for severe pain. Patient not taking: Reported on 02/06/2022 07/22/21   Couture, Cortni S, PA-C  albuterol (PROVENTIL HFA;VENTOLIN HFA) 108 (90 Base) MCG/ACT inhaler Inhale 1-2 puffs into the lungs every 6 (six) hours as needed for wheezing. 02/25/18 04/10/20  Tanna Furry, MD      Allergies    Sulfa antibiotics    Review of Systems   Review of Systems  All other systems reviewed and are negative.   Physical Exam Updated Vital Signs BP (!) 155/95 (BP Location: Left Arm)   Pulse (!) 118   Temp 98.9 F (37.2 C) (Oral)   Resp 19   Ht 1.829 m (6')   Wt 122.5 kg   SpO2 99%   BMI 36.62 kg/m  Physical Exam Vitals and nursing note reviewed.  Constitutional:      General: He is not in acute distress.    Appearance: Normal  appearance. He is well-developed. He is not toxic-appearing.  HENT:     Head: Normocephalic and atraumatic.  Eyes:     General: Lids are normal.     Conjunctiva/sclera: Conjunctivae normal.     Pupils: Pupils are equal, round, and reactive to light.  Neck:     Thyroid: No thyroid mass.     Trachea: No tracheal deviation.  Cardiovascular:     Rate and Rhythm: Normal rate and regular rhythm.     Heart sounds: Normal heart sounds. No murmur heard.    No gallop.  Pulmonary:     Effort: Pulmonary effort is normal. No respiratory distress.     Breath sounds: Normal breath sounds. No stridor. No decreased breath sounds, wheezing, rhonchi or rales.  Abdominal:     General: There is no distension.     Palpations: Abdomen is soft.     Tenderness: There is no abdominal tenderness. There is no rebound.  Genitourinary:    Penis: Circumcised.      Testes:        Right: Mass and tenderness present.    Musculoskeletal:        General: No tenderness. Normal range of motion.     Cervical back: Normal range of motion and neck supple.  Skin:    General: Skin  is warm and dry.     Findings: No abrasion or rash.  Neurological:     Mental Status: He is alert and oriented to person, place, and time. Mental status is at baseline.     GCS: GCS eye subscore is 4. GCS verbal subscore is 5. GCS motor subscore is 6.     Cranial Nerves: No cranial nerve deficit.     Sensory: No sensory deficit.     Motor: Motor function is intact.  Psychiatric:        Attention and Perception: Attention normal.        Speech: Speech normal.        Behavior: Behavior normal.     ED Results / Procedures / Treatments   Labs (all labs ordered are listed, but only abnormal results are displayed) Labs Reviewed  CBC WITH DIFFERENTIAL/PLATELET  BASIC METABOLIC PANEL  URINALYSIS, ROUTINE W REFLEX MICROSCOPIC    EKG None  Radiology No results found.  Procedures Procedures    Medications Ordered in  ED Medications  lactated ringers infusion (has no administration in time range)  HYDROmorphone (DILAUDID) injection 1 mg (has no administration in time range)    ED Course/ Medical Decision Making/ A&P                             Medical Decision Making Amount and/or Complexity of Data Reviewed Labs: ordered. Radiology: ordered.  Risk Prescription drug management.   Patient here complaining of right testicular pain.  History of scrotal abscess.  Had an ultrasound which was negative for abscess but does shows a hydrocele per my interpretation.  Urinalysis negative for infection here.  Suspect that he has early cellulitis.  Will place on antibiotics as well as give pain medication.  Will give urology referral as well as he was instructed to use a scrotal support        Final Clinical Impression(s) / ED Diagnoses Final diagnoses:  None    Rx / DC Orders ED Discharge Orders     None         Lacretia Leigh, MD 02/07/23 1815

## 2023-08-22 ENCOUNTER — Encounter (HOSPITAL_COMMUNITY): Payer: Self-pay

## 2023-08-22 ENCOUNTER — Other Ambulatory Visit: Payer: Self-pay

## 2023-08-22 ENCOUNTER — Emergency Department (HOSPITAL_COMMUNITY)
Admission: EM | Admit: 2023-08-22 | Discharge: 2023-08-22 | Disposition: A | Discharge status: Home / Self Care | Payer: Self-pay | Attending: Emergency Medicine | Admitting: Emergency Medicine

## 2023-08-22 DIAGNOSIS — L0291 Cutaneous abscess, unspecified: Secondary | ICD-10-CM

## 2023-08-22 DIAGNOSIS — L0231 Cutaneous abscess of buttock: Secondary | ICD-10-CM | POA: Insufficient documentation

## 2023-08-22 MED ORDER — LIDOCAINE-EPINEPHRINE (PF) 2 %-1:200000 IJ SOLN
20.0000 mL | Freq: Once | INTRAMUSCULAR | Status: AC
  Administered 2023-08-22: 20 mL
  Filled 2023-08-22: qty 20

## 2023-08-22 MED ORDER — HYDROCODONE-ACETAMINOPHEN 5-325 MG PO TABS: 1.0000 | ORAL_TABLET | Freq: Four times a day (QID) | ORAL | 0 refills | Status: DC | PRN

## 2023-08-22 MED ORDER — HYDROCODONE-ACETAMINOPHEN 5-325 MG PO TABS
1.0000 | ORAL_TABLET | Freq: Once | ORAL | Status: AC
  Administered 2023-08-22: 1 via ORAL
  Filled 2023-08-22: qty 1

## 2023-08-22 MED ORDER — DOXYCYCLINE HYCLATE 100 MG PO CAPS: 100.0000 mg | ORAL_CAPSULE | Freq: Two times a day (BID) | ORAL | 0 refills | Status: DC

## 2023-08-22 NOTE — ED Notes (Signed)
ID at bed side.

## 2023-08-22 NOTE — ED Notes (Addendum)
I

## 2023-08-22 NOTE — Discharge Instructions (Addendum)
You were seen in the emergency room today for abscess.  I have sent antibiotics, please take the doxycycline as prescribed and make sure you finish the prescribed dose.  Please alternate Tylenol and ibuprofen as needed for pain control, you can use the Norco for breakthrough pain as needed.  I have attached: Dermatology phone number, please call and see if they can schedule an appointment for you.  If they are not able to get you when quickly you can try other dermatology offices in the area.  If our symptoms do not improve within 2 to 3 days I would like you to return to the emergency room.  If any of your symptoms worsen you can always come back sooner.

## 2023-08-22 NOTE — ED Provider Notes (Signed)
[place] EMERGENCY DEPARTMENT AT [facility] Provider Note   CSN: [id number] Arrival date & time: 08/22/23  1010     History  Chief Complaint  Patient presents with   Abscess    Ryan Lester is a 27 y.o. male with past medical history of recurrent abscess.  Presenting with large abscess over right lateral buttock.  He reports it has been present for approximately 6 days, reports pain, denies drainage.  Reports that the pain is localized to skin around the abscess.  Patient reports abscess has been getting worse and becoming more painful the past 2 days.  Patient works as a and is often sitting long period of time.  Denies fevers chills nausea vomiting or abdominal pain.   Abscess      Home Medications Prior to Admission medications   Medication Sig Start Date End Date Taking? Authorizing Provider  doxycycline (VIBRAMYCIN) 100 MG capsule Take 1 capsule (100 mg total) by mouth 2 (two) times daily. One po bid x 7 days Patient not taking: Reported on 02/06/2022 02/04/22   Melene Plan, DO  doxycycline (VIBRAMYCIN) 100 MG capsule Take 1 capsule (100 mg total) by mouth 2 (two) times daily. 02/07/23   Lorre Nick, MD  ibuprofen (ADVIL) 200 MG tablet Take 800 mg by mouth every 6 (six) hours as needed for moderate pain (for pain).    [provider]  oxyCODONE-acetaminophen (PERCOCET/ROXICET) 5-325 MG tablet Take 1 tablet by mouth every 6 (six) hours as needed for severe pain. Patient not taking: Reported on 02/06/2022 07/22/21   Couture, Cortni S, PA-C  oxyCODONE-acetaminophen (PERCOCET/ROXICET) 5-325 MG tablet Take 1 tablet by mouth every 6 (six) hours as needed for severe pain. 02/07/23   Lorre Nick, MD  albuterol (PROVENTIL HFA;VENTOLIN HFA) 108 (90 Base) MCG/ACT inhaler Inhale 1-2 puffs into the lungs every 6 (six) hours as needed for wheezing. 02/25/18 04/10/20  Rolland Porter, MD      Allergies    Sulfa antibiotics    Review of Systems   Review of Systems   Skin:        Abscess    Physical Exam Updated Vital Signs BP (!) 152/100 (BP Location: Right Arm)   Pulse (!) 114   Temp 100.1 F (37.8 C) (Oral)   Resp 18   Ht 6' (1.829 m)   Wt 122.5 kg   SpO2 94%   BMI 36.62 kg/m  Physical Exam Vitals and nursing note reviewed.  Constitutional:      General: He is not in acute distress.    Appearance: He is not toxic-appearing.  HENT:     Head: Normocephalic and atraumatic.  Eyes:     General: No scleral icterus.    Conjunctiva/sclera: Conjunctivae normal.  Cardiovascular:     Rate and Rhythm: Normal rate and regular rhythm.     Pulses: Normal pulses.     Heart sounds: Normal heart sounds.  Pulmonary:     Effort: Pulmonary effort is normal. No respiratory distress.     Breath sounds: Normal breath sounds.  Abdominal:     General: Abdomen is flat. Bowel sounds are normal.     Palpations: Abdomen is soft.     Tenderness: There is no abdominal tenderness.  Musculoskeletal:     Right lower leg: No edema.     Left lower leg: No edema.  Skin:    General: Skin is warm and dry.     Findings: No lesion.     Comments: Large  abscess approximately 6 cm x 5 cm located on right lateral buttock, non-draining, large area of fluctuance, TTP, no surrounding cellulitis.   Neurological:     General: No focal deficit present.     Mental Status: He is alert and oriented to person, place, and time. Mental status is at baseline.     ED Results / Procedures / Treatments   Labs (all labs ordered are listed, but only abnormal results are displayed) Labs Reviewed - No data to display  EKG None  Radiology No results found.  Procedures .Marland KitchenIncision and Drainage  Date/Time: 08/22/2023 1:41 PM  Performed by: Smitty Knudsen, PA-C Authorized by: Smitty Knudsen, PA-C   Consent:    Consent obtained:  Verbal   Consent given by:  Patient   Risks, benefits, and alternatives were discussed: yes     Risks discussed:  Bleeding, incomplete  drainage, pain, infection and damage to other organs   Alternatives discussed:  No treatment and delayed treatment Universal protocol:    Procedure explained and questions answered to patient or proxy's satisfaction: yes     Relevant documents present and verified: yes     Test results available : yes     Imaging studies available: yes     Required blood products, implants, devices, and special equipment available: yes     Site/side marked: yes     Immediately prior to procedure, a time out was called: yes     Patient identity confirmed:  Verbally with patient Location:    Type:  Abscess   Location:  Lower extremity   Lower extremity location:  Buttock   Buttock location:  R buttock Pre-procedure details:    Skin preparation:  Antiseptic wash Sedation:    Sedation type:  None Anesthesia:    Anesthesia method:  Local infiltration   Local anesthetic:  Lidocaine 2% WITH epi Procedure type:    Complexity:  Simple Procedure details:    Ultrasound guidance: no     Needle aspiration: no     Incision types:  Stab incision   Incision depth:  Dermal   Wound management:  Extensive cleaning   Drainage:  Purulent and bloody   Drainage amount:  Copious   Wound treatment:  Wound left open   Packing materials:  1/4 in iodoform gauze Post-procedure details:    Procedure completion:  Tolerated     Medications Ordered in ED Medications - No data to display  ED Course/ Medical Decision Making/ A&P                                 Medical Decision Making Risk Prescription drug management.   Davone D Kamel 27 y.o. presented today for a abscess to their lateral buttock. Working Ddx: FB, fracture, abscess, simple laceration.  R/o DDx: These ddx are considered less likely due to history of present illness and physical exam findings.  Pmhx considered: recurrent abscess   They are neurovascularly intact. Tetanus is UTD. Patient is in no distress. Abscess will be numbed, drained and  packed. Patient to remove packing in 2-3 days upon following up with PCP.    Review of prior external notes: None   Labs: none  Imaging: none   Problem List / ED Course / Critical interventions / Medication management  Patient presenting for abscess, will I&D. Patient presented for a 6x4cm cm abscess to their right lateral buttock.  Patient reports he has  had abscesses like this in the past that have been treated with incision and drainage in the emergency room and sent home with doxycycline.  Patient reports that abscesses always need incision and drainage to improve.  Patient has been putting off today's visit as she was hoping that her symptoms would resolve on her own.  Patient is curious about what he can do to prevent abscesses in the past.  Discussed abscess prevention and care.  Will refer to dermatology for recurrent abscess, discussed with patient that typically time to see dermatology is delayed.  I would recommend following up with primary care in the meantime.  Discussed findings warranting return. I ordered medication including Norco and local infiltration  Reevaluation of the patient after these medicines showed that the patient improved Patients vitals assessed. Upon arrival patient is hemodynamically stable.  I have reviewed the patients home medicines and have made adjustments as needed  Consult: None     Plan: Patient has quarter inch packing, sent Doxycycline to pharmacy  F/u in 2-3 days.  Patient is stable for discharge at this time. Patient/family expressed understanding of return precautions and need for follow-up.  Keep incision clean, covered. Educated on sx of concern regarding complications -- such as infection, poor wound healing, compartment sx.          Final Clinical Impression(s) / ED Diagnoses Final diagnoses:  None    Rx / DC Orders ED Discharge Orders     None         Smitty Knudsen, PA-C 08/22/23 1345    Virgina Norfolk,  DO 08/22/23 1414

## 2023-08-22 NOTE — ED Notes (Addendum)
Ambulatory to desk, steady gait, ready to go. Delay explained.

## 2023-08-22 NOTE — ED Triage Notes (Signed)
Pt complaining of abscess on the right side of his butt. Pt states it has been present since Sunday. Hx of abscess's.

## 2023-12-19 ENCOUNTER — Emergency Department (HOSPITAL_COMMUNITY)
Admission: EM | Admit: 2023-12-19 | Discharge: 2023-12-20 | Discharge status: Against Medical Advice | Payer: Self-pay | Attending: Emergency Medicine | Admitting: Emergency Medicine

## 2023-12-19 ENCOUNTER — Other Ambulatory Visit: Payer: Self-pay

## 2023-12-19 ENCOUNTER — Encounter (HOSPITAL_COMMUNITY): Payer: Self-pay | Admitting: *Deleted

## 2023-12-19 DIAGNOSIS — N492 Inflammatory disorders of scrotum: Secondary | ICD-10-CM | POA: Insufficient documentation

## 2023-12-19 DIAGNOSIS — Z5321 Procedure and treatment not carried out due to patient leaving prior to being seen by health care provider: Secondary | ICD-10-CM | POA: Insufficient documentation

## 2023-12-19 LAB — BASIC METABOLIC PANEL
Anion gap: 12 (ref 5–15)
BUN: 11 mg/dL (ref 6–20)
CO2: 22 mmol/L (ref 22–32)
Calcium: 8.8 mg/dL — ABNORMAL LOW (ref 8.9–10.3)
Chloride: 103 mmol/L (ref 98–111)
Creatinine, Ser: 1.11 mg/dL (ref 0.61–1.24)
GFR, Estimated: >60 mL/min (ref >60)
Glucose, Bld: 141 mg/dL — ABNORMAL HIGH (ref 70–99)
Potassium: 3.5 mmol/L (ref 3.5–5.1)
Sodium: 137 mmol/L (ref 135–145)

## 2023-12-19 LAB — CBC WITH DIFFERENTIAL/PLATELET
Abs Immature Granulocytes: 0.07 10*3/uL (ref 0.00–0.07)
Basophils Absolute: 0.1 10*3/uL (ref 0.0–0.1)
Basophils Relative: 1 %
Eosinophils Absolute: 0.2 10*3/uL (ref 0.0–0.5)
Eosinophils Relative: 2 %
HCT: 46.2 % (ref 39.0–52.0)
Hemoglobin: 14.8 g/dL (ref 13.0–17.0)
Immature Granulocytes: 1 %
Lymphocytes Relative: 18 %
Lymphs Abs: 2.5 10*3/uL (ref 0.7–4.0)
MCH: 29.2 pg (ref 26.0–34.0)
MCHC: 32 g/dL (ref 30.0–36.0)
MCV: 91.3 fL (ref 80.0–100.0)
Monocytes Absolute: 1.2 10*3/uL — ABNORMAL HIGH (ref 0.1–1.0)
Monocytes Relative: 9 %
Neutro Abs: 9.8 10*3/uL — ABNORMAL HIGH (ref 1.7–7.7)
Neutrophils Relative %: 69 %
Platelets: 315 10*3/uL (ref 150–400)
RBC: 5.06 MIL/uL (ref 4.22–5.81)
RDW: 14.1 % (ref 11.5–15.5)
WBC: 13.9 10*3/uL — ABNORMAL HIGH (ref 4.0–10.5)
nRBC: 0 % (ref 0.0–0.2)

## 2023-12-19 MED ORDER — OXYCODONE-ACETAMINOPHEN 5-325 MG PO TABS
1.0000 | ORAL_TABLET | Freq: Once | ORAL | Status: AC
  Administered 2023-12-19: 1 via ORAL
  Filled 2023-12-19: qty 1

## 2023-12-19 NOTE — ED Provider Triage Note (Signed)
Emergency Medicine Provider Triage Evaluation Note  Ryan Lester , a 28 y.o. male  was evaluated in triage.  Pt complains of testicular abscess.  Review of Systems  Positive: Scrotal abscess Negative: Fever, vomiting  Physical Exam  BP (!) 152/111   Pulse (!) 113   Temp 98.5 F (36.9 C) (Oral)   Resp 18   SpO2 97%  Gen:   Awake, no distress   Resp:  Normal effort  MSK:   Moves extremities without difficulty  Other:    Medical Decision Making  Medically screening exam initiated at 10:39 PM.  Appropriate orders placed.  Roni D Trickey was informed that the remainder of the evaluation will be completed by another provider, this initial triage assessment does not replace that evaluation, and the importance of remaining in the ED until their evaluation is complete.  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 History of scrotal abscess in 2023 that was taken to OR, per patient. Current abscess x 2 days   Elpidio Anis, PA-C 12/19/23 2240

## 2023-12-19 NOTE — ED Triage Notes (Signed)
Pt has a scrotal abscess x 4 days, worse in the past 2 days, no drainage; has had the same in the past requiring sedation for I&D

## 2023-12-20 ENCOUNTER — Emergency Department (HOSPITAL_COMMUNITY): Payer: Self-pay

## 2023-12-20 ENCOUNTER — Other Ambulatory Visit: Payer: Self-pay

## 2023-12-20 ENCOUNTER — Inpatient Hospital Stay (HOSPITAL_COMMUNITY)
Admission: EM | Admit: 2023-12-20 | Discharge: 2023-12-22 | DRG: 855 | Disposition: A | Discharge status: Home / Self Care | Payer: Self-pay | Attending: Internal Medicine | Admitting: Internal Medicine

## 2023-12-20 DIAGNOSIS — Z833 Family history of diabetes mellitus: Secondary | ICD-10-CM

## 2023-12-20 DIAGNOSIS — R6510 Systemic inflammatory response syndrome (SIRS) of non-infectious origin without acute organ dysfunction: Secondary | ICD-10-CM | POA: Diagnosis present

## 2023-12-20 DIAGNOSIS — Z8249 Family history of ischemic heart disease and other diseases of the circulatory system: Secondary | ICD-10-CM

## 2023-12-20 DIAGNOSIS — Z79899 Other long term (current) drug therapy: Secondary | ICD-10-CM

## 2023-12-20 DIAGNOSIS — Z720 Tobacco use: Secondary | ICD-10-CM

## 2023-12-20 DIAGNOSIS — E669 Obesity, unspecified: Secondary | ICD-10-CM | POA: Diagnosis present

## 2023-12-20 DIAGNOSIS — Z882 Allergy status to sulfonamides status: Secondary | ICD-10-CM

## 2023-12-20 DIAGNOSIS — R309 Painful micturition, unspecified: Secondary | ICD-10-CM | POA: Diagnosis present

## 2023-12-20 DIAGNOSIS — N492 Inflammatory disorders of scrotum: Principal | ICD-10-CM

## 2023-12-20 DIAGNOSIS — A419 Sepsis, unspecified organism: Principal | ICD-10-CM

## 2023-12-20 DIAGNOSIS — F17210 Nicotine dependence, cigarettes, uncomplicated: Secondary | ICD-10-CM | POA: Diagnosis present

## 2023-12-20 LAB — URINALYSIS, ROUTINE W REFLEX MICROSCOPIC
Bacteria, UA: NONE SEEN
Bilirubin Urine: NEGATIVE
Glucose, UA: NEGATIVE mg/dL
Ketones, ur: NEGATIVE mg/dL
Leukocytes,Ua: NEGATIVE
Nitrite: NEGATIVE
Protein, ur: 30 mg/dL — AB
RBC / HPF: >50 RBC/hpf (ref 0–5)
Specific Gravity, Urine: 1.027 (ref 1.005–1.030)
pH: 7 (ref 5.0–8.0)

## 2023-12-20 LAB — CBC WITH DIFFERENTIAL/PLATELET
Abs Immature Granulocytes: 0.05 10*3/uL (ref 0.00–0.07)
Basophils Absolute: 0.1 10*3/uL (ref 0.0–0.1)
Basophils Relative: 1 %
Eosinophils Absolute: 0.2 10*3/uL (ref 0.0–0.5)
Eosinophils Relative: 2 %
HCT: 46.7 % (ref 39.0–52.0)
Hemoglobin: 15 g/dL (ref 13.0–17.0)
Immature Granulocytes: 0 %
Lymphocytes Relative: 14 %
Lymphs Abs: 1.7 10*3/uL (ref 0.7–4.0)
MCH: 28.9 pg (ref 26.0–34.0)
MCHC: 32.1 g/dL (ref 30.0–36.0)
MCV: 90 fL (ref 80.0–100.0)
Monocytes Absolute: 0.6 10*3/uL (ref 0.1–1.0)
Monocytes Relative: 5 %
Neutro Abs: 9.5 10*3/uL — ABNORMAL HIGH (ref 1.7–7.7)
Neutrophils Relative %: 78 %
Platelets: 298 10*3/uL (ref 150–400)
RBC: 5.19 MIL/uL (ref 4.22–5.81)
RDW: 13.8 % (ref 11.5–15.5)
WBC: 12.2 10*3/uL — ABNORMAL HIGH (ref 4.0–10.5)
nRBC: 0 % (ref 0.0–0.2)

## 2023-12-20 LAB — COMPREHENSIVE METABOLIC PANEL
ALT: 31 U/L (ref 0–44)
AST: 25 U/L (ref 15–41)
Albumin: 3.6 g/dL (ref 3.5–5.0)
Alkaline Phosphatase: 54 U/L (ref 38–126)
Anion gap: 9 (ref 5–15)
BUN: 8 mg/dL (ref 6–20)
CO2: 24 mmol/L (ref 22–32)
Calcium: 9.3 mg/dL (ref 8.9–10.3)
Chloride: 103 mmol/L (ref 98–111)
Creatinine, Ser: 1.19 mg/dL (ref 0.61–1.24)
GFR, Estimated: >60 mL/min (ref >60)
Glucose, Bld: 132 mg/dL — ABNORMAL HIGH (ref 70–99)
Potassium: 3.8 mmol/L (ref 3.5–5.1)
Sodium: 136 mmol/L (ref 135–145)
Total Bilirubin: 1.1 mg/dL (ref 0.0–1.2)
Total Protein: 8.1 g/dL (ref 6.5–8.1)

## 2023-12-20 LAB — APTT: aPTT: 30 s (ref 24–36)

## 2023-12-20 LAB — HIV ANTIBODY (ROUTINE TESTING W REFLEX): HIV Screen 4th Generation wRfx: NONREACTIVE

## 2023-12-20 LAB — PHOSPHORUS: Phosphorus: 3.1 mg/dL (ref 2.5–4.6)

## 2023-12-20 LAB — PROCALCITONIN: Procalcitonin: <0.1 ng/mL

## 2023-12-20 LAB — LIPASE, BLOOD: Lipase: 37 U/L (ref 11–51)

## 2023-12-20 LAB — CK: Total CK: 463 U/L — ABNORMAL HIGH (ref 49–397)

## 2023-12-20 LAB — PROTIME-INR
INR: 1.1 (ref 0.8–1.2)
Prothrombin Time: 14.4 s (ref 11.4–15.2)

## 2023-12-20 LAB — MAGNESIUM: Magnesium: 2 mg/dL (ref 1.7–2.4)

## 2023-12-20 LAB — LACTIC ACID, PLASMA: Lactic Acid, Venous: 1 mmol/L (ref 0.5–1.9)

## 2023-12-20 MED ORDER — IOHEXOL 350 MG/ML SOLN
75.0000 mL | Freq: Once | INTRAVENOUS | Status: AC | PRN
  Administered 2023-12-20: 75 mL via INTRAVENOUS

## 2023-12-20 MED ORDER — SODIUM CHLORIDE 0.9 % IV BOLUS
500.0000 mL | Freq: Once | INTRAVENOUS | Status: AC
  Administered 2023-12-20: 500 mL via INTRAVENOUS

## 2023-12-20 MED ORDER — PIPERACILLIN-TAZOBACTAM 3.375 G IVPB
3.3750 g | Freq: Three times a day (TID) | INTRAVENOUS | Status: DC
  Administered 2023-12-21 – 2023-12-22 (×5): 3.375 g via INTRAVENOUS
  Filled 2023-12-20 (×5): qty 50

## 2023-12-20 MED ORDER — HYDROCODONE-ACETAMINOPHEN 5-325 MG PO TABS
1.0000 | ORAL_TABLET | ORAL | Status: DC | PRN
  Administered 2023-12-21 – 2023-12-22 (×5): 2 via ORAL
  Filled 2023-12-20 (×5): qty 2

## 2023-12-20 MED ORDER — VANCOMYCIN HCL 1250 MG/250ML IV SOLN
1250.0000 mg | Freq: Two times a day (BID) | INTRAVENOUS | Status: DC
  Administered 2023-12-21 – 2023-12-22 (×3): 1250 mg via INTRAVENOUS
  Filled 2023-12-20 (×6): qty 250

## 2023-12-20 MED ORDER — HYDROMORPHONE HCL 1 MG/ML IJ SOLN
1.0000 mg | Freq: Once | INTRAMUSCULAR | Status: AC
  Administered 2023-12-20: 1 mg via INTRAVENOUS
  Filled 2023-12-20: qty 1

## 2023-12-20 MED ORDER — LACTATED RINGERS IV SOLN
150.0000 mL/h | INTRAVENOUS | Status: AC
  Administered 2023-12-20 – 2023-12-21 (×3): 150 mL/h via INTRAVENOUS

## 2023-12-20 MED ORDER — SODIUM CHLORIDE 0.9 % IV SOLN: INTRAVENOUS | Status: DC

## 2023-12-20 MED ORDER — HYDROMORPHONE HCL 1 MG/ML IJ SOLN
0.5000 mg | INTRAMUSCULAR | Status: DC | PRN
  Administered 2023-12-20 – 2023-12-21 (×5): 1 mg via INTRAVENOUS
  Filled 2023-12-20 (×5): qty 1

## 2023-12-20 MED ORDER — PIPERACILLIN-TAZOBACTAM 3.375 G IVPB 30 MIN
3.3750 g | Freq: Once | INTRAVENOUS | Status: AC
  Administered 2023-12-20: 3.375 g via INTRAVENOUS
  Filled 2023-12-20: qty 50

## 2023-12-20 MED ORDER — VANCOMYCIN HCL 2000 MG/400ML IV SOLN
2000.0000 mg | Freq: Once | INTRAVENOUS | Status: AC
  Administered 2023-12-20: 2000 mg via INTRAVENOUS
  Filled 2023-12-20: qty 400

## 2023-12-20 MED ORDER — ONDANSETRON HCL 4 MG/2ML IJ SOLN
4.0000 mg | Freq: Once | INTRAMUSCULAR | Status: AC
  Administered 2023-12-20: 4 mg via INTRAVENOUS
  Filled 2023-12-20: qty 2

## 2023-12-20 MED ORDER — KETAMINE HCL 50 MG/5ML IJ SOSY
125.0000 mg | PREFILLED_SYRINGE | Freq: Once | INTRAMUSCULAR | Status: AC
  Administered 2023-12-20: 125 mg via INTRAVENOUS
  Filled 2023-12-20: qty 15

## 2023-12-20 MED ORDER — NICOTINE 21 MG/24HR TD PT24
21.0000 mg | MEDICATED_PATCH | Freq: Every day | TRANSDERMAL | Status: DC
  Administered 2023-12-20 – 2023-12-22 (×3): 21 mg via TRANSDERMAL
  Filled 2023-12-20 (×4): qty 1

## 2023-12-20 MED ORDER — LIDOCAINE-EPINEPHRINE 1 %-1:100000 IJ SOLN
20.0000 mL | Freq: Once | INTRAMUSCULAR | Status: DC
  Filled 2023-12-20: qty 1

## 2023-12-20 MED ORDER — MIDAZOLAM HCL 2 MG/2ML IJ SOLN
INTRAMUSCULAR | Status: AC
  Filled 2023-12-20: qty 2

## 2023-12-20 MED ORDER — MORPHINE SULFATE (PF) 4 MG/ML IV SOLN
4.0000 mg | Freq: Once | INTRAVENOUS | Status: AC
  Administered 2023-12-20: 4 mg via INTRAVENOUS
  Filled 2023-12-20: qty 1

## 2023-12-20 MED ORDER — LIDOCAINE HCL (PF) 1 % IJ SOLN
30.0000 mL | Freq: Once | INTRAMUSCULAR | Status: AC
  Administered 2023-12-20: 30 mL
  Filled 2023-12-20: qty 30

## 2023-12-20 MED ORDER — ACETAMINOPHEN 650 MG RE SUPP: 650.0000 mg | Freq: Four times a day (QID) | RECTAL | Status: DC | PRN

## 2023-12-20 MED ORDER — ONDANSETRON HCL 4 MG PO TABS: 4.0000 mg | ORAL_TABLET | Freq: Four times a day (QID) | ORAL | Status: DC | PRN

## 2023-12-20 MED ORDER — ACETAMINOPHEN 325 MG PO TABS: 650.0000 mg | ORAL_TABLET | Freq: Four times a day (QID) | ORAL | Status: DC | PRN

## 2023-12-20 MED ORDER — ONDANSETRON HCL 4 MG/2ML IJ SOLN: 4.0000 mg | Freq: Four times a day (QID) | INTRAMUSCULAR | Status: DC | PRN

## 2023-12-20 MED ORDER — MIDAZOLAM HCL 2 MG/2ML IJ SOLN
1.0000 mg | Freq: Once | INTRAMUSCULAR | Status: AC
  Administered 2023-12-20: 1 mg via INTRAVENOUS

## 2023-12-20 NOTE — Assessment & Plan Note (Signed)
Recurrent, Patient may have hidradenitis suppurativa Check HIV status Appreciate urology consult continue vancomycin and Zosyn for now await results of wound culture MRSA serologies

## 2023-12-20 NOTE — ED Provider Notes (Signed)
[place] EMERGENCY DEPARTMENT AT [facility] Provider Note   CSN: [id number] Arrival date & time: 12/20/23  1601     History  Chief Complaint  Patient presents with   Testicle Pain   Abscess    Ryan Lester is a 28 y.o. male.  Patient is a 28 year old male with a history of obesity and prior abscesses who is presenting today with pain and swelling in his right scrotum that started approximately 3 days ago and has progressed and become larger with severe pain with palpation.  Also having some pain with urination.  He denies any penile discharge or issues with having stool.  No fever or abdominal pain.  He denies any vomiting.   Testicle Pain  Abscess      Home Medications Prior to Admission medications   Medication Sig Start Date End Date Taking? Authorizing Provider  doxycycline (VIBRAMYCIN) 100 MG capsule Take 1 capsule (100 mg total) by mouth 2 (two) times daily. 08/22/23   Barrett, Horald Chestnut, PA-C  HYDROcodone-acetaminophen (NORCO/VICODIN) 5-325 MG tablet Take 1 tablet by mouth every 6 (six) hours as needed for severe pain. 08/22/23   Barrett, Horald Chestnut, PA-C  ibuprofen (ADVIL) 200 MG tablet Take 800 mg by mouth every 6 (six) hours as needed for moderate pain (for pain). Patient not taking: Reported on 08/22/2023    [provider]  oxyCODONE-acetaminophen (PERCOCET/ROXICET) 5-325 MG tablet Take 1 tablet by mouth every 6 (six) hours as needed for severe pain. Patient not taking: Reported on 02/06/2022 07/22/21   Couture, Cortni S, PA-C  oxyCODONE-acetaminophen (PERCOCET/ROXICET) 5-325 MG tablet Take 1 tablet by mouth every 6 (six) hours as needed for severe pain. Patient not taking: Reported on 08/22/2023 02/07/23   Lorre Nick, MD  albuterol (PROVENTIL HFA;VENTOLIN HFA) 108 (90 Base) MCG/ACT inhaler Inhale 1-2 puffs into the lungs every 6 (six) hours as needed for wheezing. 02/25/18 04/10/20  Rolland Porter, MD      Allergies    Sulfa antibiotics    Review of  Systems   Review of Systems  Genitourinary:  Positive for testicular pain.    Physical Exam Updated Vital Signs BP 133/77 (BP Location: Right Arm)   Pulse (!) 107   Temp 98.2 F (36.8 C) (Temporal)   Resp 19   SpO2 99%  Physical Exam Vitals and nursing note reviewed. Exam conducted with a chaperone present.  Constitutional:      General: He is not in acute distress.    Appearance: He is well-developed.  HENT:     Head: Normocephalic and atraumatic.  Eyes:     Conjunctiva/sclera: Conjunctivae normal.     Pupils: Pupils are equal, round, and reactive to light.  Cardiovascular:     Rate and Rhythm: Normal rate and regular rhythm.     Heart sounds: No murmur heard. Pulmonary:     Effort: Pulmonary effort is normal. No respiratory distress.     Breath sounds: Normal breath sounds. No wheezing or rales.  Abdominal:     General: There is no distension.     Palpations: Abdomen is soft.     Tenderness: There is no abdominal tenderness. There is no guarding or rebound.  Genitourinary:      Comments: Slight discharge noted at the urethral meatus Musculoskeletal:        General: No tenderness. Normal range of motion.     Cervical back: Normal range of motion and neck supple.  Skin:    General: Skin is warm  and dry.     Findings: No erythema or rash.  Neurological:     Mental Status: He is alert and oriented to person, place, and time.  Psychiatric:        Behavior: Behavior normal.     ED Results / Procedures / Treatments   Labs (all labs ordered are listed, but only abnormal results are displayed) Labs Reviewed  COMPREHENSIVE METABOLIC PANEL - Abnormal; Notable for the following components:      Result Value   Glucose, Bld 132 (*)    All other components within normal limits  CBC WITH DIFFERENTIAL/PLATELET - Abnormal; Notable for the following components:   WBC 12.2 (*)    Neutro Abs 9.5 (*)    All other components within normal limits  URINALYSIS, ROUTINE W REFLEX  MICROSCOPIC - Abnormal; Notable for the following components:   Color, Urine AMBER (*)    Hgb urine dipstick SMALL (*)    Protein, ur 30 (*)    All other components within normal limits  AEROBIC/ANAEROBIC CULTURE W GRAM STAIN (SURGICAL/DEEP WOUND)  URINE CULTURE  LIPASE, BLOOD    EKG None  Radiology CT PELVIS W CONTRAST Result Date: 12/20/2023 CLINICAL DATA:  Scrotal mass or lump, concern with testicular abscess. EXAM: CT PELVIS WITH CONTRAST TECHNIQUE: Multidetector CT imaging of the pelvis was performed using the standard protocol following the bolus administration of intravenous contrast. RADIATION DOSE REDUCTION: This exam was performed according to the departmental dose-optimization program which includes automated exposure control, adjustment of the mA and/or kV according to patient size and/or use of iterative reconstruction technique. CONTRAST:  75mL OMNIPAQUE IOHEXOL 350 MG/ML SOLN COMPARISON:  Scrotal ultrasound 12/20/2023 FINDINGS: Urinary Tract:  No abnormality visualized. Bowel:  Unremarkable visualized pelvic bowel loops. Vascular/Lymphatic: No pathologically enlarged lymph nodes. No significant vascular abnormality seen. Reproductive: Prostate is unremarkable. Small bilateral hydroceles. Within the right scrotum there is a peripherally enhancing fluid collection or mass measuring 3.0 x 2.0 cm (series 5/image 158). Adjacent edema. Mild adjacent stranding. No soft tissue gas. Other: None. Musculoskeletal: No acute fracture. IMPRESSION: Findings suggest abscess in the right scrotum. Clinical or sonographic follow-up to resolution is recommended to exclude underlying mass. Electronically Signed   By: Minerva Fester M.D.   On: 12/20/2023 19:52   US SCROTUM W/DOPPLER Result Date: 12/20/2023 CLINICAL DATA:  Testicular pain. EXAM: SCROTAL ULTRASOUND DOPPLER ULTRASOUND OF THE TESTICLES TECHNIQUE: Complete ultrasound examination of the testicles, epididymis, and other scrotal structures was  performed. Color and spectral Doppler ultrasound were also utilized to evaluate blood flow to the testicles. COMPARISON:  February 07, 2023. FINDINGS: Right testicle Measurements: 5.6 x 3.4 x 2.3 cm. No mass or microlithiasis visualized. 3.6 x 3.7 x 2.8 cm complex structure is seen inferior to the right testicle concerning for possible abscess or less likely neoplasm. Left testicle Measurements: 5.1 x 3.5 x 2.2 cm. No mass or microlithiasis visualized. Right epididymis:  Normal in size and appearance. Left epididymis:  Normal in size and appearance. Hydrocele:  Small right hydrocele is noted. Varicocele:  None. Pulsed Doppler interrogation of both testes demonstrates normal low resistance arterial and venous waveforms bilaterally. IMPRESSION: No evidence of testicular torsion. 3.6 x 3.7 x 2.8 cm complex abnormality is seen inferior to the right testicle concerning for possible abscess or less likely neoplasm. Consultation with urology is recommended. Small right hydrocele. Electronically Signed   By: Lupita Raider M.D.   On: 12/20/2023 17:46    Procedures .Sedation  Date/Time: 12/20/2023 9:08 PM  Performed by: Gwyneth Sprout, MD Authorized by: Gwyneth Sprout, MD   Consent:    Consent obtained:  Verbal and written   Consent given by:  Patient   Risks discussed:  Inadequate sedation, nausea and vomiting   Alternatives discussed:  Analgesia without sedation Universal protocol:    Procedure explained and questions answered to patient or proxy's satisfaction: yes     Relevant documents present and verified: yes     Imaging studies available: yes     Immediately prior to procedure, a time out was called: yes     Patient identity confirmed:  Verbally with patient Indications:    Procedure performed:  Incision and drainage   Procedure necessitating sedation performed by:  Different physician Pre-sedation assessment:    Time since last food or drink:  5 hours   ASA classification: class 1 -  normal, healthy patient     Mouth opening:  3 or more finger widths   Thyromental distance:  3 finger widths   Mallampati score:  III - soft palate, base of uvula visible   Neck mobility: normal     Pre-sedation assessments completed and reviewed: airway patency, cardiovascular function, hydration status, mental status, nausea/vomiting, pain level, respiratory function and temperature   A pre-sedation assessment was completed prior to the start of the procedure Immediate pre-procedure details:    Reassessment: Patient reassessed immediately prior to procedure     Reviewed: vital signs, relevant labs/tests and NPO status     Verified: bag valve mask available, emergency equipment available, intubation equipment available, IV patency confirmed, oxygen available and suction available   Procedure details (see MAR for exact dosages):    Preoxygenation:  Nasal cannula   Sedation:  Ketamine   Intended level of sedation: deep   Analgesia:  Morphine   Intra-procedure monitoring:  Blood pressure monitoring, cardiac monitor, continuous capnometry, continuous pulse oximetry, frequent LOC assessments and frequent vital sign checks   Intra-procedure events: none     Total Provider sedation time (minutes):  15 Post-procedure details:   A post-sedation assessment was completed following the completion of the procedure.   Attendance: Constant attendance by certified staff until patient recovered     Recovery: Patient returned to pre-procedure baseline     Post-sedation assessments completed and reviewed: airway patency, cardiovascular function, hydration status, mental status, nausea/vomiting, pain level, respiratory function and temperature     Patient is stable for discharge or admission: yes     Procedure completion:  Tolerated well, no immediate complications     Medications Ordered in ED Medications  lidocaine-EPINEPHrine (XYLOCAINE W/EPI) 1 %-1:100000 (with pres) injection 20 mL (20 mLs  Infiltration Not Given 12/20/23 2054)  vancomycin (VANCOREADY) IVPB 2000 mg/400 mL (2,000 mg Intravenous New Bag/Given 12/20/23 2057)  vancomycin (VANCOREADY) IVPB 1250 mg/250 mL (has no administration in time range)  morphine (PF) 4 MG/ML injection 4 mg (4 mg Intravenous Given 12/20/23 1837)  ondansetron (ZOFRAN) injection 4 mg (4 mg Intravenous Given 12/20/23 1835)  piperacillin-tazobactam (ZOSYN) IVPB 3.375 g (0 g Intravenous Stopped 12/20/23 1928)  iohexol (OMNIPAQUE) 350 MG/ML injection 75 mL (75 mLs Intravenous Contrast Given 12/20/23 1941)  ketamine 50 mg in normal saline 5 mL (10 mg/mL) syringe (125 mg Intravenous Given 12/20/23 2021)  sodium chloride 0.9 % bolus 500 mL (0 mLs Intravenous Stopped 12/20/23 2055)  lidocaine (PF) (XYLOCAINE) 1 % injection 30 mL (30 mLs Infiltration Given 12/20/23 2022)  morphine (PF) 4 MG/ML injection 4 mg (4 mg Intravenous Given 12/20/23 2040)  HYDROmorphone (DILAUDID) injection 1 mg (1 mg Intravenous Given 12/20/23 2058)  midazolam (VERSED) injection 1 mg (1 mg Intravenous Given 12/20/23 2046)    ED Course/ Medical Decision Making/ A&P                                 Medical Decision Making Amount and/or Complexity of Data Reviewed Labs: ordered. Decision-making details documented in ED Course. Radiology: ordered and independent interpretation performed. Decision-making details documented in ED Course.  Risk Prescription drug management. Decision regarding hospitalization.   Pt with multiple medical problems and comorbidities and presenting today with a complaint that caries a high risk for morbidity and mortality.  Here today with concerns for scrotal abscess.  No evidence of Fournier's gangrene and patient does not have a history of diabetes.  He does have a history of recurrent abscess at times and could possibly have a mild case of hidradenitis suppurativa.  He denies systemic symptoms.  No findings to suggest incarcerated hernia or testicular torsion.  Independently  interpreted patient's labs and UA has blood but no signs of any infection, CMP without acute findings, CBC with mild leukocytosis of 12.  I have independently visualized and interpreted pt's images today. Scrotal ultrasound showed a large abscess.  CT requested by urology who I consulted. They evaluated the patient's CT and they will come and I&D at bedside.  We will do a ketamine sedation.  Patient will be admitted to medicine for IV antibiotics and ongoing care by urology.         Final Clinical Impression(s) / ED Diagnoses Final diagnoses:  Scrotal abscess    Rx / DC Orders ED Discharge Orders     None         Gwyneth Sprout, MD 12/20/23 2110

## 2023-12-20 NOTE — Subjective & Objective (Signed)
Patient presents with scrotal pain Known history of scrotal abscess for the past 4 days Had prior scrotal abscess in 2023 Patient initially was seen in the emergency department but left AMA came back again tonight ultrasound done showing evidence of abscess in the right scrotum Increasing pain for the past few days but no nausea vomiting no fevers or chills some dysuria though I&D done by urology Urine GC and patient recommend admission to medicine Wound culture sent urology recommends daily packing changes with Kerlix dressing with dry abdominal pad Given a dose of Zosyn in the emergency department and IV vancomycin

## 2023-12-20 NOTE — ED Notes (Signed)
 Pt left AMA

## 2023-12-20 NOTE — ED Notes (Signed)
 Patient transported to CT
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
## 2023-12-20 NOTE — Assessment & Plan Note (Signed)
 -  Spoke about importance of quitting spent 5 minutes discussing options for treatment, prior attempts at quitting, and dangers of smoking ? -At this point patient is    NOT  interested in quitting ? - order nicotine patch  ? - nursing tobacco cessation protocol ? ?

## 2023-12-20 NOTE — ED Triage Notes (Signed)
Pt c.o scrotal abscess x 4 days, no drainage. Hx of same

## 2023-12-20 NOTE — Assessment & Plan Note (Signed)
-  SIRS criteria met with  elevated white blood cell count,       Component Value Date/Time   WBC 12.2 (H) 12/20/2023 1624   LYMPHSABS 1.7 12/20/2023 1624     tachycardia   RR >20 Today's Vitals   12/20/23 2031 12/20/23 2045 12/20/23 2100 12/20/23 2115  BP: 119/86 (!) 124/98 133/77 137/76  Pulse: (!) 161 (!) 112 (!) 107 (!) 108  Resp: (!) 24 (!) 24 19 (!) 21  Temp: 98.7 F (37.1 C) (!) 97.5 F (36.4 C) 98.2 F (36.8 C) 98.7 F (37.1 C)  TempSrc: Temporal Temporal Temporal Temporal  SpO2: 98% 99% 99% 93%  PainSc: 10-Worst pain ever 9  10-Worst pain ever 8      -Most likely source being:  Cellulitis, soft tissue infection,       - Obtain serial lactic acid and procalcitonin level.  - Initiated IV antibiotics in ER: Antibiotics Given (last 72 hours)     Date/Time Action Medication Dose Rate   12/20/23 1839 New Bag/Given   piperacillin-tazobactam (ZOSYN) IVPB 3.375 g 3.375 g 100 mL/hr   12/20/23 2057 New Bag/Given   vancomycin (VANCOREADY) IVPB 2000 mg/400 mL 2,000 mg 200 mL/hr       Will continue  on : Vancomycin and Zosyn   - await results of blood and urine culture  - Rehydrate aggressively  Intravenous fluids were administered        30cc/kg fluid  9:25 PM

## 2023-12-20 NOTE — Sedation Documentation (Signed)
Procedure starting with I&D by Dr. [name] (urology) to pt's scrotum to drain abscess.

## 2023-12-20 NOTE — Progress Notes (Addendum)
Pharmacy Antibiotic Note  Nigil D Gallery is a 28 y.o. male for which pharmacy has been consulted for vancomycin and zosyn dosing for  scrotal abcsess . Per urology is being admitted for IV abx. Urology is performing I&D in ED (2/2).  SCr 1.19 WBC 12.2; T 98.9; HR 99; RR 17  Plan: Zosyn 3.375g IV q8h (4 hour infusion) Vancomycin 2000 mg once then 1250 mg q12hr (eAUC 459.9) unless change in renal function Monitor WBC, fever, renal function, cultures De-escalate when able Levels at steady state     Temp (24hrs), Avg:98.7 F (37.1 C), Min:98.5 F (36.9 C), Max:98.9 F (37.2 C)  Recent Labs  Lab 12/19/23 2312 12/20/23 1624  WBC 13.9* 12.2*  CREATININE 1.11 1.19    CrCl cannot be calculated (Unknown ideal weight.).    Allergies  Allergen Reactions   Sulfa Antibiotics Itching and Rash   Microbiology results: Pending  Thank you for allowing pharmacy to be a part of this patient's care.  Delmar Landau, PharmD, BCPS 12/20/2023 8:00 PM ED Clinical Pharmacist -  [phone]

## 2023-12-20 NOTE — ED Provider Triage Note (Signed)
Emergency Medicine Provider Triage Evaluation Note  Ryan Lester , a 28 y.o. male  was evaluated in triage.  Pt complains of scrotal abscess.  This has been present for 3 days.  He has a history of same times and has gone to the OR for drainage previously.  This feels similar to previous events.  Denies fevers or chills.  Review of Systems  Positive:  Negative:   Physical Exam  BP (!) 140/94 (BP Location: Right Arm)   Pulse (!) 113   Temp 98.9 F (37.2 C) (Oral)   Resp 16   SpO2 97%  Gen:   Awake, no distress   Resp:  Normal effort  MSK:   Moves extremities without difficulty  Other:  Scrotum enlarged and tender throughout  Medical Decision Making  Medically screening exam initiated at 4:23 PM.  Appropriate orders placed.  Ryan Lester was informed that the remainder of the evaluation will be completed by another provider, this initial triage assessment does not replace that evaluation, and the importance of remaining in the ED until their evaluation is complete.     Ryan Bandy, PA-C 12/20/23 1624

## 2023-12-20 NOTE — Sedation Documentation (Addendum)
Procedure completed by urology [name]

## 2023-12-20 NOTE — Procedures (Signed)
Procedures: Incision and drainage of scrotal abscess.   Prior to the procedure we discussed risk benefits alternatives procedure including bleeding infection progression, damage to the testicle as well as scrotum and need for long-term packing.   Sedation: Ketamine sedation performed by the ER   Procedure in detail: After ketamine sedation was underway patient was prepped and draped the scrotum was held up by nursing assistance.  Lidocaine was then instilled in the area surrounding the abscess a 15 blade was used to make an incision sagittally along the length of the abscess there was purulent drainage.  The wound edges appeared healthy and bleeding.  The abscess was then dissected bluntly using the surgeon's finger.  Once the abscess was opened enough a swab was taken and sent for culture.  The abscess was then irrigated out using Betadine saline.  The abscess was then packed with Betadine soaked gauze.  Patient tolerated the procedure well.  EBL: minimal
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
## 2023-12-21 DIAGNOSIS — R6510 Systemic inflammatory response syndrome (SIRS) of non-infectious origin without acute organ dysfunction: Secondary | ICD-10-CM

## 2023-12-21 LAB — CBC
HCT: 41.2 % (ref 39.0–52.0)
Hemoglobin: 13.2 g/dL (ref 13.0–17.0)
MCH: 29.1 pg (ref 26.0–34.0)
MCHC: 32 g/dL (ref 30.0–36.0)
MCV: 90.9 fL (ref 80.0–100.0)
Platelets: 288 10*3/uL (ref 150–400)
RBC: 4.53 MIL/uL (ref 4.22–5.81)
RDW: 13.9 % (ref 11.5–15.5)
WBC: 12.7 10*3/uL — ABNORMAL HIGH (ref 4.0–10.5)
nRBC: 0 % (ref 0.0–0.2)

## 2023-12-21 LAB — COMPREHENSIVE METABOLIC PANEL
ALT: 27 U/L (ref 0–44)
AST: 20 U/L (ref 15–41)
Albumin: 3.2 g/dL — ABNORMAL LOW (ref 3.5–5.0)
Alkaline Phosphatase: 49 U/L (ref 38–126)
Anion gap: 8 (ref 5–15)
BUN: 8 mg/dL (ref 6–20)
CO2: 24 mmol/L (ref 22–32)
Calcium: 8.8 mg/dL — ABNORMAL LOW (ref 8.9–10.3)
Chloride: 104 mmol/L (ref 98–111)
Creatinine, Ser: 1.45 mg/dL — ABNORMAL HIGH (ref 0.61–1.24)
GFR, Estimated: >60 mL/min (ref >60)
Glucose, Bld: 105 mg/dL — ABNORMAL HIGH (ref 70–99)
Potassium: 4.3 mmol/L (ref 3.5–5.1)
Sodium: 136 mmol/L (ref 135–145)
Total Bilirubin: 1 mg/dL (ref 0.0–1.2)
Total Protein: 7.5 g/dL (ref 6.5–8.1)

## 2023-12-21 LAB — LACTIC ACID, PLASMA: Lactic Acid, Venous: 0.7 mmol/L (ref 0.5–1.9)

## 2023-12-21 LAB — PHOSPHORUS: Phosphorus: 4.3 mg/dL (ref 2.5–4.6)

## 2023-12-21 LAB — MAGNESIUM: Magnesium: 1.9 mg/dL (ref 1.7–2.4)

## 2023-12-21 NOTE — Progress Notes (Signed)
PROGRESS NOTE    JASAI SORG  ZOX:[id number] DOB: October 11, 1996 DOA: 12/20/2023 PCP: Pcp, No   Brief Narrative:    Ryan Lester is a 28 y.o. male with medical history significant of scrotal abscess, history of recurrent abscesses.  He presented with scrotal swelling and pain with known history of scrotal abscesses.  He was found to have scrotal abscess that underwent incision and drainage by urology with cultures pending.  He has continued on vancomycin and Zosyn.  Urology following.  Assessment & Plan:   Principal Problem:   SIRS (systemic inflammatory response syndrome) (HCC) Active Problems:   Scrotal abscess   Sepsis (HCC)   Tobacco abuse  Assessment and Plan:   Sepsis, POA, secondary to scrotal abscess-recurrent -Status post incision and drainage by urology -Continue to monitor cultures -Continue vancomycin and Zosyn -No significant lactic acidosis  Tobacco abuse -Counseled on cessation -Nicotine patch   DVT prophylaxis: SCDs Code Status: Full Family Communication: None at bedside Disposition Plan:  Status is: Observation The patient will require care spanning > 2 midnights and should be moved to inpatient because: Need for IV medications.  Consultants:  Urology  Procedures:  Incision and drainage of scrotum 2/2 by urology  Antimicrobials:  Anti-infectives (From admission, onward)    Start     Dose/Rate Route Frequency Ordered Stop   12/21/23 1000  vancomycin (VANCOREADY) IVPB 1250 mg/250 mL        1,250 mg 166.7 mL/hr over 90 Minutes Intravenous Every 12 hours 12/20/23 2010 12/28/23 0959   12/21/23 0100  piperacillin-tazobactam (ZOSYN) IVPB 3.375 g        3.375 g 12.5 mL/hr over 240 Minutes Intravenous Every 8 hours 12/20/23 2120     12/20/23 2000  vancomycin (VANCOREADY) IVPB 2000 mg/400 mL        2,000 mg 200 mL/hr over 120 Minutes Intravenous  Once 12/20/23 1958 12/20/23 2257   12/20/23 1830  piperacillin-tazobactam (ZOSYN) IVPB 3.375 g         3.375 g 100 mL/hr over 30 Minutes Intravenous  Once 12/20/23 1816 12/20/23 1928      Subjective: Patient seen and evaluated today with ongoing pain to his scrotum.  No further drainage noted.  Objective: Vitals:   12/21/23 0600 12/21/23 0615 12/21/23 0630 12/21/23 0656  BP: 120/81 125/80 130/84 125/81  Pulse: (!) 105 (!) 104 (!) 102 (!) 108  Resp:    16  Temp:    99.5 F (37.5 C)  TempSrc:    Oral  SpO2: 96% 98% 96% 98%    Intake/Output Summary (Last 24 hours) at 12/21/2023 0708 Last data filed at 12/21/2023 0454 Gross per 24 hour  Intake 436.77 ml  Output --  Net 436.77 ml   There were no vitals filed for this visit.  Examination:  General exam: Appears calm and comfortable  Respiratory system: Clear to auscultation. Respiratory effort normal. Cardiovascular system: S1 & S2 heard, RRR.  Gastrointestinal system: Abdomen is soft Central nervous system: Alert and awake Extremities: No edema Skin: Scrotal area examined and noted to have gauze packed to wound on right side with no drainage. Psychiatry: Flat affect.    Data Reviewed: I have personally reviewed following labs and imaging studies  CBC: Recent Labs  Lab 12/19/23 2312 12/20/23 1624 12/21/23 0426  WBC 13.9* 12.2* 12.7*  NEUTROABS 9.8* 9.5*  --   HGB 14.8 15.0 13.2  HCT 46.2 46.7 41.2  MCV 91.3 90.0 90.9  PLT 315 298 288   Basic  Metabolic Panel: Recent Labs  Lab 12/19/23 2312 12/20/23 1624 12/20/23 2205 12/21/23 0426  NA 137 136  --  136  K 3.5 3.8  --  4.3  CL 103 103  --  104  CO2 22 24  --  24  GLUCOSE 141* 132*  --  105*  BUN 11 8  --  8  CREATININE 1.11 1.19  --  1.45*  CALCIUM 8.8* 9.3  --  8.8*  MG  --   --  2.0 1.9  PHOS  --   --  3.1 4.3   GFR: CrCl cannot be calculated (Unknown ideal weight.). Liver Function Tests: Recent Labs  Lab 12/20/23 1624 12/21/23 0426  AST 25 20  ALT 31 27  ALKPHOS 54 49  BILITOT 1.1 1.0  PROT 8.1 7.5  ALBUMIN 3.6 3.2*   Recent Labs  Lab  12/20/23 1624  LIPASE 37   No results for input(s): "AMMONIA" in the last 168 hours. Coagulation Profile: Recent Labs  Lab 12/20/23 2205  INR 1.1   Cardiac Enzymes: Recent Labs  Lab 12/20/23 2205  CKTOTAL 463*   BNP (last 3 results) No results for input(s): "PROBNP" in the last 8760 hours. HbA1C: No results for input(s): "HGBA1C" in the last 72 hours. CBG: No results for input(s): "GLUCAP" in the last 168 hours. Lipid Profile: No results for input(s): "CHOL", "HDL", "LDLCALC", "TRIG", "CHOLHDL", "LDLDIRECT" in the last 72 hours. Thyroid Function Tests: No results for input(s): "TSH", "T4TOTAL", "FREET4", "T3FREE", "THYROIDAB" in the last 72 hours. Anemia Panel: No results for input(s): "VITAMINB12", "FOLATE", "FERRITIN", "TIBC", "IRON", "RETICCTPCT" in the last 72 hours. Sepsis Labs: Recent Labs  Lab 12/20/23 2205 12/20/23 2314  PROCALCITON <0.10  --   LATICACIDVEN 1.0 0.7    Recent Results (from the past 240 hours)  Aerobic Culture w Gram Stain (superficial specimen)     Status: None (Preliminary result)   Collection Time: 12/20/23  8:29 PM   Specimen: Scrotum; Wound  Result Value Ref Range Status   Specimen Description SCROTUM ABSCESS  Final   Special Requests NONE  Final   Gram Stain   Final    MODERATE WBC PRESENT,BOTH PMN AND MONONUCLEAR FEW GRAM POSITIVE COCCI IN PAIRS FEW GRAM NEGATIVE RODS Performed at [facility], [address] [place], [place] [address]    Culture PENDING  Incomplete   Report Status PENDING  Incomplete  Culture, blood (x 2)     Status: None (Preliminary result)   Collection Time: 12/20/23 10:05 PM   Specimen: BLOOD RIGHT ARM  Result Value Ref Range Status   Specimen Description BLOOD RIGHT ARM  Final   Special Requests   Final    BOTTLES DRAWN AEROBIC AND ANAEROBIC Blood Culture adequate volume   Culture   Final    NO GROWTH < 12 HOURS Performed at [facility], [address], [place], [place] [address]     Report Status PENDING  Incomplete  Culture, blood (x 2)     Status: None (Preliminary result)   Collection Time: 12/20/23 10:21 PM   Specimen: BLOOD LEFT HAND  Result Value Ref Range Status   Specimen Description BLOOD LEFT HAND  Final   Special Requests   Final    BOTTLES DRAWN AEROBIC AND ANAEROBIC Blood Culture adequate volume   Culture   Final    NO GROWTH < 12 HOURS Performed at [facility], [address] [place], [place] [address]    Report  Status PENDING  Incomplete         Radiology Studies: CT PELVIS W CONTRAST Result Date: 12/20/2023 CLINICAL DATA:  Scrotal mass or lump, concern with testicular abscess. EXAM: CT PELVIS WITH CONTRAST TECHNIQUE: Multidetector CT imaging of the pelvis was performed using the standard protocol following the bolus administration of intravenous contrast. RADIATION DOSE REDUCTION: This exam was performed according to the departmental dose-optimization program which includes automated exposure control, adjustment of the mA and/or kV according to patient size and/or use of iterative reconstruction technique. CONTRAST:  75mL OMNIPAQUE IOHEXOL 350 MG/ML SOLN COMPARISON:  Scrotal ultrasound 12/20/2023 FINDINGS: Urinary Tract:  No abnormality visualized. Bowel:  Unremarkable visualized pelvic bowel loops. Vascular/Lymphatic: No pathologically enlarged lymph nodes. No significant vascular abnormality seen. Reproductive: Prostate is unremarkable. Small bilateral hydroceles. Within the right scrotum there is a peripherally enhancing fluid collection or mass measuring 3.0 x 2.0 cm (series 5/image 158). Adjacent edema. Mild adjacent stranding. No soft tissue gas. Other: None. Musculoskeletal: No acute fracture. IMPRESSION: Findings suggest abscess in the right scrotum. Clinical or sonographic follow-up to resolution is recommended to exclude underlying mass. Electronically Signed   By: Minerva Fester M.D.   On: 12/20/2023 19:52   US SCROTUM W/DOPPLER Result  Date: 12/20/2023 CLINICAL DATA:  Testicular pain. EXAM: SCROTAL ULTRASOUND DOPPLER ULTRASOUND OF THE TESTICLES TECHNIQUE: Complete ultrasound examination of the testicles, epididymis, and other scrotal structures was performed. Color and spectral Doppler ultrasound were also utilized to evaluate blood flow to the testicles. COMPARISON:  February 07, 2023. FINDINGS: Right testicle Measurements: 5.6 x 3.4 x 2.3 cm. No mass or microlithiasis visualized. 3.6 x 3.7 x 2.8 cm complex structure is seen inferior to the right testicle concerning for possible abscess or less likely neoplasm. Left testicle Measurements: 5.1 x 3.5 x 2.2 cm. No mass or microlithiasis visualized. Right epididymis:  Normal in size and appearance. Left epididymis:  Normal in size and appearance. Hydrocele:  Small right hydrocele is noted. Varicocele:  None. Pulsed Doppler interrogation of both testes demonstrates normal low resistance arterial and venous waveforms bilaterally. IMPRESSION: No evidence of testicular torsion. 3.6 x 3.7 x 2.8 cm complex abnormality is seen inferior to the right testicle concerning for possible abscess or less likely neoplasm. Consultation with urology is recommended. Small right hydrocele. Electronically Signed   By: Lupita Raider M.D.   On: 12/20/2023 17:46        Scheduled Meds:  lidocaine-EPINEPHrine  20 mL Infiltration Once   nicotine  21 mg Transdermal Daily   Continuous Infusions:  lactated ringers 150 mL/hr (12/21/23 0428)   piperacillin-tazobactam Stopped (12/21/23 0454)   vancomycin       LOS: 0 days    Time spent: 55 minutes    Jolynn Bajorek D Sherryll Burger, DO [facility]  If 7PM-7AM, please contact night-coverage [email or web address] 12/21/2023, 7:08 AM

## 2023-12-21 NOTE — Plan of Care (Signed)
  Problem: Fluid Volume: Goal: Hemodynamic stability will improve Outcome: Progressing   Problem: Clinical Measurements: Goal: Diagnostic test results will improve Outcome: Progressing Goal: Signs and symptoms of infection will decrease Outcome: Progressing   Problem: Respiratory: Goal: Ability to maintain adequate ventilation will improve Outcome: Progressing   Problem: Education: Goal: Knowledge of General Education information will improve Description: Including pain rating scale, medication(s)/side effects and non-pharmacologic comfort measures Outcome: Progressing   Problem: Health Behavior/Discharge Planning: Goal: Ability to manage health-related needs will improve Outcome: Progressing   Problem: Clinical Measurements: Goal: Ability to maintain clinical measurements within normal limits will improve Outcome: Progressing Goal: Will remain free from infection Outcome: Progressing Goal: Diagnostic test results will improve Outcome: Progressing Goal: Respiratory complications will improve Outcome: Progressing Goal: Cardiovascular complication will be avoided Outcome: Progressing   Problem: Activity: Goal: Risk for activity intolerance will decrease Outcome: Progressing   Problem: Nutrition: Goal: Adequate nutrition will be maintained Outcome: Progressing   Problem: Coping: Goal: Level of anxiety will decrease Outcome: Progressing   Problem: Elimination: Goal: Will not experience complications related to bowel motility Outcome: Progressing Goal: Will not experience complications related to urinary retention Outcome: Progressing   Problem: Pain Managment: Goal: General experience of comfort will improve and/or be controlled Outcome: Progressing   Problem: Safety: Goal: Ability to remain free from injury will improve Outcome: Progressing   Problem: Skin Integrity: Goal: Risk for impaired skin integrity will decrease Outcome: Progressing   Problem:  Clinical Measurements: Goal: Ability to avoid or minimize complications of infection will improve Outcome: Progressing   Problem: Skin Integrity: Goal: Skin integrity will improve Outcome: Progressing

## 2023-12-21 NOTE — TOC Initial Note (Signed)
Transition of Care (TOC) - Initial/Assessment Note   Spoke to patient and wife at bedside   Patient does not have PCP, agreeable to PCP at a [facility]. TOC will need to call to arrange closer to discharge.   Patient does not have insurance. Patient agreeable for NCM to consult [facility], referral sent.   NCM explained hospital nurses will provide education on dressing changes to him and his wife prior to discharge. Bedside nurse will provide some dressing supplies at discharge.   NCM changed pharmacy to [facility]. At discharge prescriptions will be sent to [facility]. [facility] will call him with cost. He can pay with card, or cash or be billed. Patient voiced understanding  Patient Details  Name: Ryan Lester MRN: [id number] Date of Birth: November 24, 1995  Transition of Care [facility]) CM/SW Contact:    Kingsley Plan, RN Phone Number: 12/21/2023, 12:34 PM  Clinical Narrative:                   Expected Discharge Plan: Home/Self Care Barriers to Discharge: Continued Medical Work up   Patient Goals and CMS Choice Patient states their goals for this hospitalization and ongoing recovery are:: to return to home          Expected Discharge Plan and Services   Discharge Planning Services: CM Consult Post Acute Care Choice: NA Living arrangements for the past 2 months: Single Family Home                 DME Arranged: N/A DME Agency: NA       HH Arranged: NA HH Agency: NA        Prior Living Arrangements/Services Living arrangements for the past 2 months: Single Family Home Lives with:: Spouse Patient language and need for interpreter reviewed:: Yes Do you feel safe going back to the place where you live?: Yes      Need for Family Participation in Patient Care: Yes (Comment) Care giver support system in place?: Yes (comment)   Criminal Activity/Legal Involvement Pertinent to Current Situation/Hospitalization: No - Comment as needed  Activities of Daily  Living      Permission Sought/Granted   Permission granted to share information with : Yes, Verbal Permission Granted  Share Information with NAME: wife Karel Jarvis  Permission granted to share info w AGENCY: PCP offices        Emotional Assessment Appearance:: Appears stated age Attitude/Demeanor/Rapport: Engaged Affect (typically observed): Accepting Orientation: : Oriented to Self, Oriented to Place, Oriented to  Time, Oriented to Situation Alcohol / Substance Use: Not Applicable Psych Involvement: No (comment)  Admission diagnosis:  Scrotal abscess [N49.2] SIRS (systemic inflammatory response syndrome) (HCC) [R65.10] Patient Active Problem List   Diagnosis Date Noted   SIRS (systemic inflammatory response syndrome) (HCC) 12/20/2023   Sepsis (HCC) 12/20/2023   Tobacco abuse 12/20/2023   Scrotal abscess 02/06/2022   Memory deficit 09/12/2013   PCP:  Pcp, No Pharmacy:   [id number] [place], [place] - [address] AT [facility] OF [address] [place] [address] Phone: [phone] Fax: [phone]  [facility] [address] [place] [address] Phone: [phone] Fax: [phone]     Social Drivers of Health (SDOH) Social History: SDOH Screenings   Tobacco Use: High Risk (12/19/2023)   SDOH Interventions:     Readmission Risk Interventions     No data to display

## 2023-12-21 NOTE — Plan of Care (Signed)
Problem: Fluid Volume: Goal: Hemodynamic stability will improve 12/21/2023 2241 by Oneal Deputy, Rickard Rhymes Chem, RN Outcome: Progressing 12/21/2023 2241 by Oneal Deputy, Rickard Rhymes Chem, RN Outcome: Progressing   Problem: Clinical Measurements: Goal: Diagnostic test results will improve 12/21/2023 2241 by Corinda Gubler, RN Outcome: Progressing 12/21/2023 2241 by Oneal Deputy, Rickard Rhymes Chem, RN Outcome: Progressing Goal: Signs and symptoms of infection will decrease 12/21/2023 2241 by Paralee Cancel Chem, RN Outcome: Progressing 12/21/2023 2241 by Oneal Deputy, Rickard Rhymes Chem, RN Outcome: Progressing   Problem: Respiratory: Goal: Ability to maintain adequate ventilation will improve 12/21/2023 2241 by Paralee Cancel Chem, RN Outcome: Progressing 12/21/2023 2241 by Oneal Deputy, Rickard Rhymes Chem, RN Outcome: Progressing   Problem: Education: Goal: Knowledge of General Education information will improve Description: Including pain rating scale, medication(s)/side effects and non-pharmacologic comfort measures 12/21/2023 2241 by Paralee Cancel Chem, RN Outcome: Progressing 12/21/2023 2241 by Oneal Deputy, Rickard Rhymes Chem, RN Outcome: Progressing   Problem: Health Behavior/Discharge Planning: Goal: Ability to manage health-related needs will improve 12/21/2023 2241 by Paralee Cancel Chem, RN Outcome: Progressing 12/21/2023 2241 by Oneal Deputy, Rickard Rhymes Chem, RN Outcome: Progressing   Problem: Clinical Measurements: Goal: Ability to maintain clinical measurements within normal limits will improve 12/21/2023 2241 by Oneal Deputy, Arta Silence, RN Outcome: Progressing 12/21/2023 2241 by Oneal Deputy, Rickard Rhymes Chem, RN Outcome: Progressing Goal: Will remain free from infection 12/21/2023 2241 by Corinda Gubler, RN Outcome: Progressing 12/21/2023 2241 by Oneal Deputy, Rickard Rhymes Chem,  RN Outcome: Progressing Goal: Diagnostic test results will improve 12/21/2023 2241 by Corinda Gubler, RN Outcome: Progressing 12/21/2023 2241 by Oneal Deputy, Rickard Rhymes Chem, RN Outcome: Progressing Goal: Respiratory complications will improve 12/21/2023 2241 by Corinda Gubler, RN Outcome: Progressing 12/21/2023 2241 by Oneal Deputy, Rickard Rhymes Chem, RN Outcome: Progressing Goal: Cardiovascular complication will be avoided 12/21/2023 2241 by Paralee Cancel Chem, RN Outcome: Progressing 12/21/2023 2241 by Paralee Cancel Chem, RN Outcome: Progressing   Problem: Activity: Goal: Risk for activity intolerance will decrease 12/21/2023 2241 by Oneal Deputy, Rickard Rhymes Chem, RN Outcome: Progressing 12/21/2023 2241 by Oneal Deputy, Tedrick Port Chem, RN Outcome: Progressing   Problem: Nutrition: Goal: Adequate nutrition will be maintained 12/21/2023 2241 by Oneal Deputy, Rickard Rhymes Chem, RN Outcome: Progressing 12/21/2023 2241 by Oneal Deputy, Rickard Rhymes Chem, RN Outcome: Progressing   Problem: Coping: Goal: Level of anxiety will decrease 12/21/2023 2241 by Paralee Cancel Chem, RN Outcome: Progressing 12/21/2023 2241 by Oneal Deputy, Rickard Rhymes Chem, RN Outcome: Progressing   Problem: Elimination: Goal: Will not experience complications related to bowel motility 12/21/2023 2241 by Corinda Gubler, RN Outcome: Progressing 12/21/2023 2241 by Oneal Deputy, Rickard Rhymes Chem, RN Outcome: Progressing Goal: Will not experience complications related to urinary retention 12/21/2023 2241 by Corinda Gubler, RN Outcome: Progressing 12/21/2023 2241 by Oneal Deputy, Rickard Rhymes Chem, RN Outcome: Progressing   Problem: Pain Managment: Goal: General experience of comfort will improve and/or be controlled 12/21/2023 2241 by Paralee Cancel Chem, RN Outcome: Progressing 12/21/2023 2241 by Oneal Deputy, Rickard Rhymes Chem, RN Outcome: Progressing   Problem: Safety: Goal: Ability to remain free from injury will improve 12/21/2023 2241 by Corinda Gubler, RN Outcome: Progressing 12/21/2023 2241 by Oneal Deputy, Rickard Rhymes Chem, RN Outcome: Progressing   Problem: Skin Integrity: Goal: Risk for impaired skin integrity will decrease 12/21/2023 2241 by Paralee Cancel Chem, RN Outcome: Progressing 12/21/2023 2241 by Oneal Deputy, Rickard Rhymes Chem, RN Outcome: Progressing   Problem: Clinical Measurements: Goal: Ability to avoid  or minimize complications of infection will improve 12/21/2023 2241 by Oneal Deputy, Arta Silence, RN Outcome: Progressing 12/21/2023 2241 by Oneal Deputy, Arta Silence, RN Outcome: Progressing   Problem: Skin Integrity: Goal: Skin integrity will improve 12/21/2023 2241 by Corinda Gubler, RN Outcome: Progressing 12/21/2023 2241 by Paralee Cancel Chem, RN Outcome: Progressing

## 2023-12-21 NOTE — Progress Notes (Addendum)
I interviewed and examined the patient independently from [place] Faster The patient was discussed with me and I agree with the assessment and plan.   Will continue abx broad spectrum until cultures result If no result transition to bactrim BID for 14 days Patient can be discharged when wife or patient able to change dressing off IV pain meds and has been stable on PO abx.      Subjective: First time meeting Ryan Lester.  He was accompanied by his wife.  Both were sleeping on my arrival.  He was easily awoken.  Reviewed case and plan.  All questions were answered to their satisfaction.  Objective: Vital signs in last 24 hours: Temp:  [97.5 F (36.4 C)-100 F (37.8 C)] 100 F (37.8 C) (02/03 0820) Pulse Rate:  [99-161] 108 (02/03 0656) Resp:  [16-24] 16 (02/03 0820) BP: (108-144)/(63-98) 125/80 (02/03 0820) SpO2:  [93 %-99 %] 96 % (02/03 0820)  Assessment/Plan: # Scrotal abscess To the OR for I&D with Dr. Jennette Bill on 12/20/2023. I performed packing myself.  Even with 1 mg of Dilaudid premedication, patient thrashing about and did not tolerate wound care.  We discussed that the amount of pain medication that he would be able to be sent home on is significantly less than what he is receiving.  His wife is willing to do his wound care and has helped him with such things in the past, she reasonably does not want him to come home until he can tolerate better than what she witnessed today. I will see him again tomorrow, titrate his as needed medications accordingly, and perform his wound care again.  Intake/Output from previous day: 02/02 0701 - 02/03 0700 In: 436.8 [IV Piggyback:436.8] Out: -   Intake/Output this shift: No intake/output data recorded.  Physical Exam:  General: Alert and oriented CV: No cyanosis Lungs: equal chest rise Abdomen: Soft, NTND, no rebound or guarding Gu: Large right side area of resection in the right hemiscrotum.  Beefy red wound bed.  No  purulence, dishwater drainage, or fibrinous exudate  Lab Results: Recent Labs    12/19/23 2312 12/20/23 1624 12/21/23 0426  HGB 14.8 15.0 13.2  HCT 46.2 46.7 41.2   BMET Recent Labs    12/20/23 1624 12/21/23 0426  NA 136 136  K 3.8 4.3  CL 103 104  CO2 24 24  GLUCOSE 132* 105*  BUN 8 8  CREATININE 1.19 1.45*  CALCIUM 9.3 8.8*     Studies/Results: CT PELVIS W CONTRAST Result Date: 12/20/2023 CLINICAL DATA:  Scrotal mass or lump, concern with testicular abscess. EXAM: CT PELVIS WITH CONTRAST TECHNIQUE: Multidetector CT imaging of the pelvis was performed using the standard protocol following the bolus administration of intravenous contrast. RADIATION DOSE REDUCTION: This exam was performed according to the departmental dose-optimization program which includes automated exposure control, adjustment of the mA and/or kV according to patient size and/or use of iterative reconstruction technique. CONTRAST:  75mL OMNIPAQUE IOHEXOL 350 MG/ML SOLN COMPARISON:  Scrotal ultrasound 12/20/2023 FINDINGS: Urinary Tract:  No abnormality visualized. Bowel:  Unremarkable visualized pelvic bowel loops. Vascular/Lymphatic: No pathologically enlarged lymph nodes. No significant vascular abnormality seen. Reproductive: Prostate is unremarkable. Small bilateral hydroceles. Within the right scrotum there is a peripherally enhancing fluid collection or mass measuring 3.0 x 2.0 cm (series 5/image 158). Adjacent edema. Mild adjacent stranding. No soft tissue gas. Other: None. Musculoskeletal: No acute fracture. IMPRESSION: Findings suggest abscess in the right scrotum. Clinical or sonographic follow-up to resolution is recommended  to exclude underlying mass. Electronically Signed   By: Minerva Fester M.D.   On: 12/20/2023 19:52   US SCROTUM W/DOPPLER Result Date: 12/20/2023 CLINICAL DATA:  Testicular pain. EXAM: SCROTAL ULTRASOUND DOPPLER ULTRASOUND OF THE TESTICLES TECHNIQUE: Complete ultrasound examination of  the testicles, epididymis, and other scrotal structures was performed. Color and spectral Doppler ultrasound were also utilized to evaluate blood flow to the testicles. COMPARISON:  February 07, 2023. FINDINGS: Right testicle Measurements: 5.6 x 3.4 x 2.3 cm. No mass or microlithiasis visualized. 3.6 x 3.7 x 2.8 cm complex structure is seen inferior to the right testicle concerning for possible abscess or less likely neoplasm. Left testicle Measurements: 5.1 x 3.5 x 2.2 cm. No mass or microlithiasis visualized. Right epididymis:  Normal in size and appearance. Left epididymis:  Normal in size and appearance. Hydrocele:  Small right hydrocele is noted. Varicocele:  None. Pulsed Doppler interrogation of both testes demonstrates normal low resistance arterial and venous waveforms bilaterally. IMPRESSION: No evidence of testicular torsion. 3.6 x 3.7 x 2.8 cm complex abnormality is seen inferior to the right testicle concerning for possible abscess or less likely neoplasm. Consultation with urology is recommended. Small right hydrocele. Electronically Signed   By: Lupita Raider M.D.   On: 12/20/2023 17:46      LOS: 0 days   Ryan Kirschner, NP [facility] Pager: [phone]  12/21/2023, 2:25 PM

## 2023-12-22 LAB — CBC
HCT: 39.3 % (ref 39.0–52.0)
Hemoglobin: 12.7 g/dL — ABNORMAL LOW (ref 13.0–17.0)
MCH: 29.1 pg (ref 26.0–34.0)
MCHC: 32.3 g/dL (ref 30.0–36.0)
MCV: 89.9 fL (ref 80.0–100.0)
Platelets: 286 10*3/uL (ref 150–400)
RBC: 4.37 MIL/uL (ref 4.22–5.81)
RDW: 13.6 % (ref 11.5–15.5)
WBC: 9.3 10*3/uL (ref 4.0–10.5)
nRBC: 0 % (ref 0.0–0.2)

## 2023-12-22 LAB — URINE CULTURE
Culture: NO GROWTH
Special Requests: NORMAL

## 2023-12-22 LAB — BASIC METABOLIC PANEL
Anion gap: 8 (ref 5–15)
BUN: 8 mg/dL (ref 6–20)
CO2: 24 mmol/L (ref 22–32)
Calcium: 8.7 mg/dL — ABNORMAL LOW (ref 8.9–10.3)
Chloride: 104 mmol/L (ref 98–111)
Creatinine, Ser: 1.24 mg/dL (ref 0.61–1.24)
GFR, Estimated: >60 mL/min (ref >60)
Glucose, Bld: 101 mg/dL — ABNORMAL HIGH (ref 70–99)
Potassium: 3.7 mmol/L (ref 3.5–5.1)
Sodium: 136 mmol/L (ref 135–145)

## 2023-12-22 LAB — MAGNESIUM: Magnesium: 2 mg/dL (ref 1.7–2.4)

## 2023-12-22 MED ORDER — ENOXAPARIN SODIUM 60 MG/0.6ML IJ SOSY: 60.0000 mg | PREFILLED_SYRINGE | Freq: Every day | INTRAMUSCULAR | Status: DC

## 2023-12-22 MED ORDER — HYDROCODONE-ACETAMINOPHEN 5-325 MG PO TABS: 1.0000 | ORAL_TABLET | Freq: Four times a day (QID) | ORAL | 0 refills | Status: DC | PRN

## 2023-12-22 MED ORDER — AMOXICILLIN-POT CLAVULANATE 875-125 MG PO TABS: 1.0000 | ORAL_TABLET | Freq: Two times a day (BID) | ORAL | 0 refills | Status: AC

## 2023-12-22 NOTE — Progress Notes (Addendum)
 Attestation: I interviewed and examined the patient myself. He is much improved and able to do dressing changes without IV pain meds. Patient can be discharged on 14 days Augmentin . Instructed to do dressing changes daily.   Urology will set up f/u for wound check in a few months   Ryan Lester      Subjective: Patient reports that following the wound care his baseline pain was significantly improved yesterday and he has not had much difficulty overnight.  No acute events.  Objective: Vital signs in last 24 hours: Temp:  [97.4 F (36.3 C)-98.8 F (37.1 C)] 98.1 F (36.7 C) (02/04 0941) Pulse Rate:  [91-99] 94 (02/04 0941) Resp:  [16-20] 19 (02/04 0941) BP: (103-131)/(55-96) 131/85 (02/04 0941) SpO2:  [92 %-98 %] 98 % (02/04 0538)  Assessment/Plan: # Scrotal abscess To the OR for I&D with Dr. Pea on 12/20/2023. Wound care went significantly better today.  I was able to  assist his wife in packing his wound.  This was completed without pain medication and was relatively well-tolerated. Patient reports a diffuse body rash in response to Bactrim.  2 weeks Augmentin  with close follow-up in clinic.  Please have patient contact [facility] and schedule with Dr. Pea. Nursing, please supply family with Kerlix, sterile swabs, and a bottle of sterile water  on discharge. Okay to discharge home from a urologic perspective.  Case and plan reviewed with Dr. Pea  [facility] [address] second floor [place], [place]  [address] [phone]   Intake/Output from previous day: 02/03 0701 - 02/04 0700 In: 1754.4 [I.V.:1555.4; IV Piggyback:199] Out: -   Intake/Output this shift: No intake/output data recorded.  Physical Exam:  General: Alert and oriented CV: No cyanosis Lungs: equal chest rise Abdomen: Soft, NTND, no rebound or guarding Gu: Large right side area of resection in the right hemiscrotum.  Beefy red wound bed.  No  purulence, dishwater drainage, or fibrinous exudate  Lab Results: Recent Labs    12/20/23 1624 12/21/23 0426 12/22/23 0632  HGB 15.0 13.2 12.7*  HCT 46.7 41.2 39.3   BMET Recent Labs    12/21/23 0426 12/22/23 0632  NA 136 136  K 4.3 3.7  CL 104 104  CO2 24 24  GLUCOSE 105* 101*  BUN 8 8  CREATININE 1.45* 1.24  CALCIUM 8.8* 8.7*     Studies/Results: CT PELVIS W CONTRAST Result Date: 12/20/2023 CLINICAL DATA:  Scrotal mass or lump, concern with testicular abscess. EXAM: CT PELVIS WITH CONTRAST TECHNIQUE: Multidetector CT imaging of the pelvis was performed using the standard protocol following the bolus administration of intravenous contrast. RADIATION DOSE REDUCTION: This exam was performed according to the departmental dose-optimization program which includes automated exposure control, adjustment of the mA and/or kV according to patient size and/or use of iterative reconstruction technique. CONTRAST:  75mL OMNIPAQUE  IOHEXOL  350 MG/ML SOLN COMPARISON:  Scrotal ultrasound 12/20/2023 FINDINGS: Urinary Tract:  No abnormality visualized. Bowel:  Unremarkable visualized pelvic bowel loops. Vascular/Lymphatic: No pathologically enlarged lymph nodes. No significant vascular abnormality seen. Reproductive: Prostate is unremarkable. Small bilateral hydroceles. Within the right scrotum there is a peripherally enhancing fluid collection or mass measuring 3.0 x 2.0 cm (series 5/image 158). Adjacent edema. Mild adjacent stranding. No soft tissue gas. Other: None. Musculoskeletal: No acute fracture. IMPRESSION: Findings suggest abscess in the right scrotum. Clinical or sonographic follow-up to resolution is recommended to exclude underlying mass. Electronically Signed   By: Norman Gatlin M.D.   On: 12/20/2023 19:52  US  SCROTUM W/DOPPLER Result Date: 12/20/2023 CLINICAL DATA:  Testicular pain. EXAM: SCROTAL ULTRASOUND DOPPLER ULTRASOUND OF THE TESTICLES TECHNIQUE: Complete ultrasound examination of  the testicles, epididymis, and other scrotal structures was performed. Color and spectral Doppler ultrasound were also utilized to evaluate blood flow to the testicles. COMPARISON:  February 07, 2023. FINDINGS: Right testicle Measurements: 5.6 x 3.4 x 2.3 cm. No mass or microlithiasis visualized. 3.6 x 3.7 x 2.8 cm complex structure is seen inferior to the right testicle concerning for possible abscess or less likely neoplasm. Left testicle Measurements: 5.1 x 3.5 x 2.2 cm. No mass or microlithiasis visualized. Right epididymis:  Normal in size and appearance. Left epididymis:  Normal in size and appearance. Hydrocele:  Small right hydrocele is noted. Varicocele:  None. Pulsed Doppler interrogation of both testes demonstrates normal low resistance arterial and venous waveforms bilaterally. IMPRESSION: No evidence of testicular torsion. 3.6 x 3.7 x 2.8 cm complex abnormality is seen inferior to the right testicle concerning for possible abscess or less likely neoplasm. Consultation with urology is recommended. Small right hydrocele. Electronically Signed   By: Lynwood Landy Raddle M.D.   On: 12/20/2023 17:46      LOS: 1 day   Ole Bourdon, NP [facility] Pager: [phone]  12/22/2023, 11:15 AM

## 2023-12-22 NOTE — Discharge Summary (Signed)
 Physician Discharge Summary  Ryan Lester:[id number] DOB: 1996/04/16 DOA: 12/20/2023  PCP: Freddrick, No  Admit date: 12/20/2023  Discharge date: 12/22/2023  Admitted From:Home  Disposition:  Home  Recommendations for Outpatient Follow-up:  Follow up with PCP in 1-2 weeks Follow up with Dr. Shane Urology in a few weeks for wound check Continue on Augmentin  as prescribed for 14-day course Continue wound care at home Pain medications prescribed as needed  Home Health: None  Equipment/Devices: None  Discharge Condition:Stable  CODE STATUS: Full  Diet recommendation: Regular diet  Brief/Interim Summary: Ryan Lester is a 28 y.o. male with medical history significant of scrotal abscess, history of recurrent abscesses.  He presented with scrotal swelling and pain with known history of scrotal abscesses.  He was found to have scrotal abscess that underwent incision and drainage by urology with cultures showing no growth.  He has had wound care performed by urology and recommendations are to remain on Augmentin  for 14 more days and to follow-up with urology outpatient.  No other acute concerns or events noted during the course of this admission.  Discharge Diagnoses:  Principal Problem:   SIRS (systemic inflammatory response syndrome) (HCC) Active Problems:   Scrotal abscess   Sepsis (HCC)   Tobacco abuse  Principal discharge diagnosis: Sepsis, POA, secondary to scrotal abscess status post incision and drainage.  Discharge Instructions  Discharge Instructions     Diet - low sodium heart healthy   Complete by: As directed    Increase activity slowly   Complete by: As directed       Allergies as of 12/22/2023       Reactions   Sulfa Antibiotics Itching, Rash        Medication List     TAKE these medications    amoxicillin -clavulanate 875-125 MG tablet Commonly known as: AUGMENTIN  Take 1 tablet by mouth 2 (two) times daily for 14 days.    HYDROcodone -acetaminophen  5-325 MG tablet Commonly known as: NORCO/VICODIN Take 1-2 tablets by mouth every 6 (six) hours as needed for moderate pain (pain score 4-6) or severe pain (pain score 7-10).        Follow-up Information     Shane Steffan BROCKS, MD. Schedule an appointment as soon as possible for a visit in 2 week(s).   Specialty: Urology Contact information: [address] [place] [place] [address] [phone]                Allergies  Allergen Reactions   Sulfa Antibiotics Itching and Rash    Consultations: Urology   Procedures/Studies: CT PELVIS W CONTRAST Result Date: 12/20/2023 CLINICAL DATA:  Scrotal mass or lump, concern with testicular abscess. EXAM: CT PELVIS WITH CONTRAST TECHNIQUE: Multidetector CT imaging of the pelvis was performed using the standard protocol following the bolus administration of intravenous contrast. RADIATION DOSE REDUCTION: This exam was performed according to the departmental dose-optimization program which includes automated exposure control, adjustment of the mA and/or kV according to patient size and/or use of iterative reconstruction technique. CONTRAST:  75mL OMNIPAQUE  IOHEXOL  350 MG/ML SOLN COMPARISON:  Scrotal ultrasound 12/20/2023 FINDINGS: Urinary Tract:  No abnormality visualized. Bowel:  Unremarkable visualized pelvic bowel loops. Vascular/Lymphatic: No pathologically enlarged lymph nodes. No significant vascular abnormality seen. Reproductive: Prostate is unremarkable. Small bilateral hydroceles. Within the right scrotum there is a peripherally enhancing fluid collection or mass measuring 3.0 x 2.0 cm (series 5/image 158). Adjacent edema. Mild adjacent stranding. No soft tissue gas. Other: None. Musculoskeletal: No acute  fracture. IMPRESSION: Findings suggest abscess in the right scrotum. Clinical or sonographic follow-up to resolution is recommended to exclude underlying mass. Electronically Signed   By: Norman Gatlin M.D.   On: 12/20/2023 19:52   US  SCROTUM W/DOPPLER Result Date: 12/20/2023 CLINICAL DATA:  Testicular pain. EXAM: SCROTAL ULTRASOUND DOPPLER ULTRASOUND OF THE TESTICLES TECHNIQUE: Complete ultrasound examination of the testicles, epididymis, and other scrotal structures was performed. Color and spectral Doppler ultrasound were also utilized to evaluate blood flow to the testicles. COMPARISON:  February 07, 2023. FINDINGS: Right testicle Measurements: 5.6 x 3.4 x 2.3 cm. No mass or microlithiasis visualized. 3.6 x 3.7 x 2.8 cm complex structure is seen inferior to the right testicle concerning for possible abscess or less likely neoplasm. Left testicle Measurements: 5.1 x 3.5 x 2.2 cm. No mass or microlithiasis visualized. Right epididymis:  Normal in size and appearance. Left epididymis:  Normal in size and appearance. Hydrocele:  Small right hydrocele is noted. Varicocele:  None. Pulsed Doppler interrogation of both testes demonstrates normal low resistance arterial and venous waveforms bilaterally. IMPRESSION: No evidence of testicular torsion. 3.6 x 3.7 x 2.8 cm complex abnormality is seen inferior to the right testicle concerning for possible abscess or less likely neoplasm. Consultation with urology is recommended. Small right hydrocele. Electronically Signed   By: Lynwood Landy Raddle M.D.   On: 12/20/2023 17:46     Discharge Exam: Vitals:   12/22/23 0538 12/22/23 0941  BP: (!) 125/96 131/85  Pulse: 91 94  Resp:  19  Temp: (!) 97.4 F (36.3 C) 98.1 F (36.7 C)  SpO2: 98%    Vitals:   12/21/23 2017 12/22/23 0002 12/22/23 0538 12/22/23 0941  BP: 120/73 (!) 103/59 (!) 125/96 131/85  Pulse: 93 98 91 94  Resp: 20   19  Temp: 98.2 F (36.8 C) 98.2 F (36.8 C) (!) 97.4 F (36.3 C) 98.1 F (36.7 C)  TempSrc: Oral  Oral Oral  SpO2: 94% 92% 98%     General: Pt is alert, awake, not in acute distress Cardiovascular: RRR, S1/S2 +, no rubs, no gallops Respiratory: CTA bilaterally, no  wheezing, no rhonchi Abdominal: Soft, NT, ND, bowel sounds + Extremities: no edema, no cyanosis    The results of significant diagnostics from this hospitalization (including imaging, microbiology, ancillary and laboratory) are listed below for reference.     Microbiology: Recent Results (from the past 240 hours)  Aerobic Culture w Gram Stain (superficial specimen)     Status: None (Preliminary result)   Collection Time: 12/20/23  8:29 PM   Specimen: Scrotum; Wound  Result Value Ref Range Status   Specimen Description SCROTUM ABSCESS  Final   Special Requests NONE  Final   Gram Stain   Final    MODERATE WBC PRESENT,BOTH PMN AND MONONUCLEAR FEW GRAM POSITIVE COCCI IN PAIRS FEW GRAM NEGATIVE RODS    Culture   Final    NO GROWTH 2 DAYS Performed at [facility] Lab, [address] [place], [place] [address]    Report Status PENDING  Incomplete  Urine Culture (for pregnant, neutropenic or urologic patients or patients with an indwelling urinary catheter)     Status: None   Collection Time: 12/20/23  9:06 PM   Specimen: Urine, Clean Catch  Result Value Ref Range Status   Specimen Description URINE, CLEAN CATCH  Final   Special Requests Normal  Final   Culture   Final    NO GROWTH Performed at [facility]  Lab, [address] [place], [place] [address]    Report Status 12/22/2023 FINAL  Final  Culture, blood (x 2)     Status: None (Preliminary result)   Collection Time: 12/20/23 10:05 PM   Specimen: BLOOD RIGHT ARM  Result Value Ref Range Status   Specimen Description BLOOD RIGHT ARM  Final   Special Requests   Final    BOTTLES DRAWN AEROBIC AND ANAEROBIC Blood Culture adequate volume   Culture   Final    NO GROWTH 2 DAYS Performed at [facility], [address] [place], [place] [address]    Report Status PENDING  Incomplete  Culture, blood (x 2)     Status: None (Preliminary result)   Collection Time: 12/20/23 10:21 PM   Specimen: BLOOD LEFT HAND  Result  Value Ref Range Status   Specimen Description BLOOD LEFT HAND  Final   Special Requests   Final    BOTTLES DRAWN AEROBIC AND ANAEROBIC Blood Culture adequate volume   Culture   Final    NO GROWTH 2 DAYS Performed at [facility] Lab, [address], [place], [place] [address]    Report Status PENDING  Incomplete     Labs: BNP (last 3 results) No results for input(s): BNP in the last 8760 hours. Basic Metabolic Panel: Recent Labs  Lab 12/19/23 2312 12/20/23 1624 12/20/23 2205 12/21/23 0426 12/22/23 0632  NA 137 136  --  136 136  K 3.5 3.8  --  4.3 3.7  CL 103 103  --  104 104  CO2 22 24  --  24 24  GLUCOSE 141* 132*  --  105* 101*  BUN 11 8  --  8 8  CREATININE 1.11 1.19  --  1.45* 1.24  CALCIUM 8.8* 9.3  --  8.8* 8.7*  MG  --   --  2.0 1.9 2.0  PHOS  --   --  3.1 4.3  --    Liver Function Tests: Recent Labs  Lab 12/20/23 1624 12/21/23 0426  AST 25 20  ALT 31 27  ALKPHOS 54 49  BILITOT 1.1 1.0  PROT 8.1 7.5  ALBUMIN 3.6 3.2*   Recent Labs  Lab 12/20/23 1624  LIPASE 37   No results for input(s): AMMONIA in the last 168 hours. CBC: Recent Labs  Lab 12/19/23 2312 12/20/23 1624 12/21/23 0426 12/22/23 0632  WBC 13.9* 12.2* 12.7* 9.3  NEUTROABS 9.8* 9.5*  --   --   HGB 14.8 15.0 13.2 12.7*  HCT 46.2 46.7 41.2 39.3  MCV 91.3 90.0 90.9 89.9  PLT 315 298 288 286   Cardiac Enzymes: Recent Labs  Lab 12/20/23 2205  CKTOTAL 463*   BNP: Invalid input(s): POCBNP CBG: No results for input(s): GLUCAP in the last 168 hours. D-Dimer No results for input(s): DDIMER in the last 72 hours. Hgb A1c No results for input(s): HGBA1C in the last 72 hours. Lipid Profile No results for input(s): CHOL, HDL, LDLCALC, TRIG, CHOLHDL, LDLDIRECT in the last 72 hours. Thyroid function studies No results for input(s): TSH, T4TOTAL, T3FREE, THYROIDAB in the last 72 hours.  Invalid input(s): FREET3 Anemia work up No results for  input(s): VITAMINB12, FOLATE, FERRITIN, TIBC, IRON, RETICCTPCT in the last 72 hours. Urinalysis    Component Value Date/Time   COLORURINE AMBER (A) 12/20/2023 1910   APPEARANCEUR CLEAR 12/20/2023 1910   LABSPEC 1.027 12/20/2023 1910   PHURINE 7.0 12/20/2023 1910   GLUCOSEU NEGATIVE 12/20/2023 1910  HGBUR SMALL (A) 12/20/2023 1910   BILIRUBINUR NEGATIVE 12/20/2023 1910   KETONESUR NEGATIVE 12/20/2023 1910   PROTEINUR 30 (A) 12/20/2023 1910   UROBILINOGEN 1.0 01/28/2013 1734   NITRITE NEGATIVE 12/20/2023 1910   LEUKOCYTESUR NEGATIVE 12/20/2023 1910   Sepsis Labs Recent Labs  Lab 12/19/23 2312 12/20/23 1624 12/21/23 0426 12/22/23 0632  WBC 13.9* 12.2* 12.7* 9.3   Microbiology Recent Results (from the past 240 hours)  Aerobic Culture w Gram Stain (superficial specimen)     Status: None (Preliminary result)   Collection Time: 12/20/23  8:29 PM   Specimen: Scrotum; Wound  Result Value Ref Range Status   Specimen Description SCROTUM ABSCESS  Final   Special Requests NONE  Final   Gram Stain   Final    MODERATE WBC PRESENT,BOTH PMN AND MONONUCLEAR FEW GRAM POSITIVE COCCI IN PAIRS FEW GRAM NEGATIVE RODS    Culture   Final    NO GROWTH 2 DAYS Performed at [facility], [address], [place], [place] [address]    Report Status PENDING  Incomplete  Urine Culture (for pregnant, neutropenic or urologic patients or patients with an indwelling urinary catheter)     Status: None   Collection Time: 12/20/23  9:06 PM   Specimen: Urine, Clean Catch  Result Value Ref Range Status   Specimen Description URINE, CLEAN CATCH  Final   Special Requests Normal  Final   Culture   Final    NO GROWTH Performed at [facility], [address], [place], [place] [address]    Report Status 12/22/2023 FINAL  Final  Culture, blood (x 2)     Status: None (Preliminary result)   Collection Time: 12/20/23 10:05 PM   Specimen: BLOOD RIGHT ARM  Result Value Ref Range Status    Specimen Description BLOOD RIGHT ARM  Final   Special Requests   Final    BOTTLES DRAWN AEROBIC AND ANAEROBIC Blood Culture adequate volume   Culture   Final    NO GROWTH 2 DAYS Performed at [facility], [address] [place], [place] [address]    Report Status PENDING  Incomplete  Culture, blood (x 2)     Status: None (Preliminary result)   Collection Time: 12/20/23 10:21 PM   Specimen: BLOOD LEFT HAND  Result Value Ref Range Status   Specimen Description BLOOD LEFT HAND  Final   Special Requests   Final    BOTTLES DRAWN AEROBIC AND ANAEROBIC Blood Culture adequate volume   Culture   Final    NO GROWTH 2 DAYS Performed at [facility] Lab, [address], [place], [place] [address]    Report Status PENDING  Incomplete     Time coordinating discharge: 35 minutes  SIGNED:   Adron JONETTA Fairly, DO [facility] 12/22/2023, 11:50 AM  If 7PM-7AM, please contact night-coverage [email or web address]

## 2023-12-22 NOTE — Plan of Care (Signed)
  Problem: Fluid Volume: Goal: Hemodynamic stability will improve Outcome: Completed/Met   Problem: Clinical Measurements: Goal: Diagnostic test results will improve Outcome: Completed/Met Goal: Signs and symptoms of infection will decrease Outcome: Completed/Met   Problem: Respiratory: Goal: Ability to maintain adequate ventilation will improve Outcome: Completed/Met   Problem: Education: Goal: Knowledge of General Education information will improve Description: Including pain rating scale, medication(s)/side effects and non-pharmacologic comfort measures Outcome: Completed/Met   Problem: Health Behavior/Discharge Planning: Goal: Ability to manage health-related needs will improve Outcome: Completed/Met   Problem: Clinical Measurements: Goal: Ability to maintain clinical measurements within normal limits will improve Outcome: Completed/Met Goal: Will remain free from infection Outcome: Completed/Met Goal: Diagnostic test results will improve Outcome: Completed/Met Goal: Respiratory complications will improve Outcome: Completed/Met Goal: Cardiovascular complication will be avoided Outcome: Completed/Met   Problem: Activity: Goal: Risk for activity intolerance will decrease Outcome: Completed/Met   Problem: Nutrition: Goal: Adequate nutrition will be maintained Outcome: Completed/Met   Problem: Coping: Goal: Level of anxiety will decrease Outcome: Completed/Met   Problem: Elimination: Goal: Will not experience complications related to bowel motility Outcome: Completed/Met Goal: Will not experience complications related to urinary retention Outcome: Completed/Met   Problem: Pain Managment: Goal: General experience of comfort will improve and/or be controlled Outcome: Completed/Met   Problem: Safety: Goal: Ability to remain free from injury will improve Outcome: Completed/Met   Problem: Skin Integrity: Goal: Risk for impaired skin integrity will  decrease Outcome: Completed/Met   Problem: Clinical Measurements: Goal: Ability to avoid or minimize complications of infection will improve Outcome: Completed/Met   Problem: Skin Integrity: Goal: Skin integrity will improve Outcome: Completed/Met

## 2023-12-25 LAB — CULTURE, BLOOD (ROUTINE X 2)
Culture: NO GROWTH
Culture: NO GROWTH
Special Requests: ADEQUATE
Special Requests: ADEQUATE

## 2023-12-28 LAB — AEROBIC CULTURE W GRAM STAIN (SUPERFICIAL SPECIMEN)

## 2024-01-10 ENCOUNTER — Emergency Department (HOSPITAL_COMMUNITY)
Admission: EM | Admit: 2024-01-10 | Discharge: 2024-01-10 | Discharge status: Against Medical Advice | Payer: Self-pay | Attending: Emergency Medicine | Admitting: Emergency Medicine

## 2024-01-10 ENCOUNTER — Emergency Department (HOSPITAL_COMMUNITY): Payer: Self-pay

## 2024-01-10 ENCOUNTER — Other Ambulatory Visit: Payer: Self-pay

## 2024-01-10 DIAGNOSIS — N5082 Scrotal pain: Secondary | ICD-10-CM | POA: Insufficient documentation

## 2024-01-10 DIAGNOSIS — N5089 Other specified disorders of the male genital organs: Secondary | ICD-10-CM | POA: Insufficient documentation

## 2024-01-10 LAB — CBC WITH DIFFERENTIAL/PLATELET
Abs Immature Granulocytes: 0.04 10*3/uL (ref 0.00–0.07)
Basophils Absolute: 0.1 10*3/uL (ref 0.0–0.1)
Basophils Relative: 1 %
Eosinophils Absolute: 0.2 10*3/uL (ref 0.0–0.5)
Eosinophils Relative: 1 %
HCT: 47.9 % (ref 39.0–52.0)
Hemoglobin: 14.8 g/dL (ref 13.0–17.0)
Immature Granulocytes: 0 %
Lymphocytes Relative: 21 %
Lymphs Abs: 2.7 10*3/uL (ref 0.7–4.0)
MCH: 28.7 pg (ref 26.0–34.0)
MCHC: 30.9 g/dL (ref 30.0–36.0)
MCV: 92.8 fL (ref 80.0–100.0)
Monocytes Absolute: 1.3 10*3/uL — ABNORMAL HIGH (ref 0.1–1.0)
Monocytes Relative: 10 %
Neutro Abs: 8.4 10*3/uL — ABNORMAL HIGH (ref 1.7–7.7)
Neutrophils Relative %: 67 %
Platelets: 295 10*3/uL (ref 150–400)
RBC: 5.16 MIL/uL (ref 4.22–5.81)
RDW: 14.2 % (ref 11.5–15.5)
WBC: 12.6 10*3/uL — ABNORMAL HIGH (ref 4.0–10.5)
nRBC: 0 % (ref 0.0–0.2)

## 2024-01-10 LAB — BASIC METABOLIC PANEL
Anion gap: 6 (ref 5–15)
BUN: 8 mg/dL (ref 6–20)
CO2: 25 mmol/L (ref 22–32)
Calcium: 9.6 mg/dL (ref 8.9–10.3)
Chloride: 104 mmol/L (ref 98–111)
Creatinine, Ser: 1.12 mg/dL (ref 0.61–1.24)
GFR, Estimated: >60 mL/min (ref >60)
Glucose, Bld: 90 mg/dL (ref 70–99)
Potassium: 4.3 mmol/L (ref 3.5–5.1)
Sodium: 135 mmol/L (ref 135–145)

## 2024-01-10 MED ORDER — IOHEXOL 300 MG/ML  SOLN: 100.0000 mL | Freq: Once | INTRAMUSCULAR | Status: DC | PRN

## 2024-01-10 NOTE — ED Notes (Signed)
 Pt walked out

## 2024-01-10 NOTE — ED Provider Notes (Signed)
 [place] EMERGENCY DEPARTMENT AT [facility] Provider Note   CSN: [id number] Arrival date & time: 01/10/24  1031     History  Chief Complaint  Patient presents with   Abscess   HPI Ryan Lester is a 28 y.o. male with recent scrotal abscess status post I&D 2 weeks ago presenting for concern for swelling in the scrotal area.  States that 2 days ago he noticed pain and swelling in the right upper scrotum.  States this is new as been relatively pain-free since the procedure.  Denies any oozing or pustulant discharge from the incisional site.  Denies fever.  Denies any pain radiating into the abdomen and pelvis.  Denies any abnormal penile discharge or bleeding.  Denies genital rash.   Abscess      Home Medications Prior to Admission medications   Medication Sig Start Date End Date Taking? Authorizing Provider  HYDROcodone-acetaminophen (NORCO/VICODIN) 5-325 MG tablet Take 1-2 tablets by mouth every 6 (six) hours as needed for moderate pain (pain score 4-6) or severe pain (pain score 7-10). 12/22/23   Sherryll Burger, Pratik D, DO  albuterol (PROVENTIL HFA;VENTOLIN HFA) 108 (90 Base) MCG/ACT inhaler Inhale 1-2 puffs into the lungs every 6 (six) hours as needed for wheezing. 02/25/18 04/10/20  Rolland Porter, MD      Allergies    Sulfa antibiotics    Review of Systems   See HPI for pertinent positives  Physical Exam Updated Vital Signs BP (!) 152/92   Pulse 99   Temp 98.3 F (36.8 C)   Resp 18   Ht 6' (1.829 m)   Wt 123 kg   SpO2 98%   BMI 36.78 kg/m  Physical Exam Exam conducted with a chaperone present.  Constitutional:      Appearance: Normal appearance.  HENT:     Head: Normocephalic.     Nose: Nose normal.  Eyes:     Conjunctiva/sclera: Conjunctivae normal.  Pulmonary:     Effort: Pulmonary effort is normal.  Abdominal:     General: There is no distension.     Palpations: Abdomen is soft.     Tenderness: There is no abdominal tenderness.   Genitourinary:      Comments: Notable swelling and induration about the Neurological:     Mental Status: He is alert.  Psychiatric:        Mood and Affect: Mood normal.     ED Results / Procedures / Treatments   Labs (all labs ordered are listed, but only abnormal results are displayed) Labs Reviewed  CBC WITH DIFFERENTIAL/PLATELET - Abnormal; Notable for the following components:      Result Value   WBC 12.6 (*)    Neutro Abs 8.4 (*)    Monocytes Absolute 1.3 (*)    All other components within normal limits  BASIC METABOLIC PANEL    EKG None  Radiology US SCROTUM W/DOPPLER Result Date: 01/10/2024 CLINICAL DATA:  Two day history of pain and swelling EXAM: SCROTAL ULTRASOUND DOPPLER ULTRASOUND OF THE TESTICLES TECHNIQUE: Complete ultrasound examination of the testicles, epididymis, and other scrotal structures was performed. Color and spectral Doppler ultrasound were also utilized to evaluate blood flow to the testicles. COMPARISON:  CT pelvis and scrotal ultrasound examination dated 12/20/2023 FINDINGS: Right testicle Measurements: 5.3 x 3.2 x 2.5 cm, 30.1 mL. No mass or microlithiasis visualized. Left testicle Measurements: 4.5 x 3.3 x 2.8 cm, 29.5 mL. No mass or microlithiasis visualized. Right epididymis: Normal in size and appearance. Asymmetric echogenicity and  thickening of the right spermatic cord. Left epididymis:  Normal in size and appearance. Hydrocele:  None visualized. Varicocele:  None visualized. Pulsed Doppler interrogation of both testes demonstrates normal low resistance arterial and venous waveforms bilaterally. IMPRESSION: 1. No evidence of testicular torsion. 2. Asymmetric echogenicity and thickening of the right spermatic cord, which may represent reactive or infectious/inflammatory vasitis. No focal fluid collection. Electronically Signed   By: Agustin Cree M.D.   On: 01/10/2024 14:04    Procedures Procedures    Medications Ordered in ED Medications   iohexol (OMNIPAQUE) 300 MG/ML solution 100 mL (has no administration in time range)    ED Course/ Medical Decision Making/ A&P                                 Medical Decision Making Amount and/or Complexity of Data Reviewed Labs: ordered. Radiology: ordered.   13 well-appearing male presenting for scrotal pain and swelling.  Exam notable for swelling induration and tenderness in the right upper portion of the scrotum.  Labs reveal leukocytosis.  Scrotal ultrasound was unremarkable.  However given the concerning clinical findings, feel further characterization is warranted with a CT scan.  That scan is pending.  If there is concern for scrotal abscess, plan will be to consult urology.  If CT scan is unremarkable.  Patient can follow-up with his urologist in the clinic.  Signed out patient to PA Sabra Heck.  At this time patient has refused pain medication.        Final Clinical Impression(s) / ED Diagnoses Final diagnoses:  Scrotal swelling    Rx / DC Orders ED Discharge Orders     None         Gareth Eagle, PA-C 01/10/24 1522    Rolan Bucco, MD 01/10/24 1546    Rolan Bucco, MD 01/11/24 [phone]

## 2024-01-10 NOTE — ED Triage Notes (Signed)
 Patient to ED by POV for abscess to scrotum area. Per patient he had surgery on area two weeks ago finished ABX but noticed swelling in area. Denies drainage, fever or chills.

## 2024-01-10 NOTE — ED Provider Notes (Signed)
 Received patient at signout from previous provider.  See his note  In short, patient presents to emergency department for evaluation of pain and swelling in the right upper scrotum.  Obtained ultrasound of scrotum which showed asymmetric thickening of the right spermatic cord which may represent reactive or infectious/inflammatory vasitis.  Attempted to obtain CT abdomen pelvis with contrast however IV was supposedly infiltrated.  Following this, he was sent back to room to obtain another IV.  IV was removed and prior to attempting another IV, patient eloped from emergency department.  I was unable to assess him nor give him discharge instructions, return to emergency department precautions. He did not sign AMA. Per his previous provider note, patient was A&O x 3 and capable of making his own decisions.  He appeared stable at time of elopement   Judithann Sheen, PA 01/10/24 1609    Virgina Norfolk, DO 01/10/24 1756

## 2024-01-13 ENCOUNTER — Encounter (HOSPITAL_COMMUNITY): Payer: Self-pay

## 2024-01-13 ENCOUNTER — Other Ambulatory Visit: Payer: Self-pay

## 2024-01-13 ENCOUNTER — Emergency Department (HOSPITAL_COMMUNITY)
Admission: EM | Admit: 2024-01-13 | Discharge: 2024-01-14 | Discharge status: Against Medical Advice | Payer: Self-pay | Attending: Emergency Medicine | Admitting: Emergency Medicine

## 2024-01-13 ENCOUNTER — Emergency Department (HOSPITAL_COMMUNITY): Payer: Self-pay

## 2024-01-13 DIAGNOSIS — N492 Inflammatory disorders of scrotum: Secondary | ICD-10-CM | POA: Insufficient documentation

## 2024-01-13 DIAGNOSIS — Z5321 Procedure and treatment not carried out due to patient leaving prior to being seen by health care provider: Secondary | ICD-10-CM | POA: Insufficient documentation

## 2024-01-13 LAB — BASIC METABOLIC PANEL
Anion gap: 14 (ref 5–15)
BUN: 7 mg/dL (ref 6–20)
CO2: 22 mmol/L (ref 22–32)
Calcium: 9.7 mg/dL (ref 8.9–10.3)
Chloride: 101 mmol/L (ref 98–111)
Creatinine, Ser: 1.31 mg/dL — ABNORMAL HIGH (ref 0.61–1.24)
GFR, Estimated: >60 mL/min (ref >60)
Glucose, Bld: 133 mg/dL — ABNORMAL HIGH (ref 70–99)
Potassium: 4.7 mmol/L (ref 3.5–5.1)
Sodium: 137 mmol/L (ref 135–145)

## 2024-01-13 LAB — CBC WITH DIFFERENTIAL/PLATELET
Abs Immature Granulocytes: 0.02 10*3/uL (ref 0.00–0.07)
Basophils Absolute: 0.1 10*3/uL (ref 0.0–0.1)
Basophils Relative: 1 %
Eosinophils Absolute: 0.3 10*3/uL (ref 0.0–0.5)
Eosinophils Relative: 3 %
HCT: 47.2 % (ref 39.0–52.0)
Hemoglobin: 15.1 g/dL (ref 13.0–17.0)
Immature Granulocytes: 0 %
Lymphocytes Relative: 14 %
Lymphs Abs: 1.4 10*3/uL (ref 0.7–4.0)
MCH: 29.2 pg (ref 26.0–34.0)
MCHC: 32 g/dL (ref 30.0–36.0)
MCV: 91.1 fL (ref 80.0–100.0)
Monocytes Absolute: 0.7 10*3/uL (ref 0.1–1.0)
Monocytes Relative: 7 %
Neutro Abs: 7.7 10*3/uL (ref 1.7–7.7)
Neutrophils Relative %: 75 %
Platelets: 300 10*3/uL (ref 150–400)
RBC: 5.18 MIL/uL (ref 4.22–5.81)
RDW: 14.2 % (ref 11.5–15.5)
WBC: 10.2 10*3/uL (ref 4.0–10.5)
nRBC: 0 % (ref 0.0–0.2)

## 2024-01-13 LAB — I-STAT CG4 LACTIC ACID, ED: Lactic Acid, Venous: 0.8 mmol/L (ref 0.5–1.9)

## 2024-01-13 MED ORDER — ACETAMINOPHEN 325 MG PO TABS
650.0000 mg | ORAL_TABLET | Freq: Once | ORAL | Status: AC
  Administered 2024-01-13: 650 mg via ORAL
  Filled 2024-01-13: qty 2

## 2024-01-13 NOTE — ED Notes (Signed)
 Patient states he cannot wait any longer- pulled OTF.

## 2024-01-13 NOTE — ED Provider Triage Note (Signed)
 Emergency Medicine Provider Triage Evaluation Note  Ryan Lester , a 28 y.o. male  was evaluated in triage.  Pt complains of scrotal abscess. Significant hx of same, had OR drainage with urology at the beginning of February, did not follow-up with them. Starting having recurrence on 2/21, same side but different area. Seen for same on 2/23, eloped prior to completing work-up. States it has enlarged since then. No fevers, chills, abdominal pain.  Review of Systems  Positive:  Negative:   Physical Exam  BP (!) 147/102 (BP Location: Left Arm)   Pulse (!) 116   Temp 98.4 F (36.9 C) (Oral)   Resp 20   Ht 6' (1.829 m)   Wt 122.9 kg   SpO2 96%   BMI 36.75 kg/m  Gen:   Awake, no distress   Resp:  Normal effort  MSK:   Moves extremities without difficulty  Other:  TTP right testicle area with swelling. No crepitus or drainage.   Medical Decision Making  Medically screening exam initiated at 1:05 PM.  Appropriate orders placed.  Obi D Hassing was informed that the remainder of the evaluation will be completed by another provider, this initial triage assessment does not replace that evaluation, and the importance of remaining in the ED until their evaluation is complete.     Silva Bandy, PA-C 01/13/24 1308

## 2024-01-13 NOTE — ED Triage Notes (Signed)
 Patient has abscess to right scrotum and had surgery on it approx 3 weeks ago. Now has another abscess located right above it.  Deneis drainage or fever. Reports this one has been there for approx 4 days.

## 2024-01-14 ENCOUNTER — Inpatient Hospital Stay (HOSPITAL_COMMUNITY)
Admission: EM | Admit: 2024-01-14 | Discharge: 2024-01-16 | DRG: 717 | Discharge status: Against Medical Advice | Payer: Self-pay | Attending: Internal Medicine | Admitting: Internal Medicine

## 2024-01-14 ENCOUNTER — Emergency Department (HOSPITAL_COMMUNITY): Payer: Self-pay | Admitting: Certified Registered"

## 2024-01-14 ENCOUNTER — Encounter (HOSPITAL_COMMUNITY)
Admission: EM | Discharge status: Against Medical Advice | Payer: Self-pay | Source: Home / Self Care | Attending: Internal Medicine

## 2024-01-14 ENCOUNTER — Encounter (HOSPITAL_COMMUNITY): Payer: Self-pay

## 2024-01-14 DIAGNOSIS — B9689 Other specified bacterial agents as the cause of diseases classified elsewhere: Secondary | ICD-10-CM | POA: Diagnosis present

## 2024-01-14 DIAGNOSIS — N492 Inflammatory disorders of scrotum: Principal | ICD-10-CM | POA: Diagnosis present

## 2024-01-14 DIAGNOSIS — Z882 Allergy status to sulfonamides status: Secondary | ICD-10-CM

## 2024-01-14 DIAGNOSIS — Z833 Family history of diabetes mellitus: Secondary | ICD-10-CM

## 2024-01-14 DIAGNOSIS — Z720 Tobacco use: Secondary | ICD-10-CM | POA: Diagnosis present

## 2024-01-14 DIAGNOSIS — Z8249 Family history of ischemic heart disease and other diseases of the circulatory system: Secondary | ICD-10-CM

## 2024-01-14 DIAGNOSIS — Z6836 Body mass index (BMI) 36.0-36.9, adult: Secondary | ICD-10-CM

## 2024-01-14 DIAGNOSIS — Z5320 Procedure and treatment not carried out because of patient's decision for unspecified reasons: Secondary | ICD-10-CM | POA: Diagnosis present

## 2024-01-14 DIAGNOSIS — L02214 Cutaneous abscess of groin: Secondary | ICD-10-CM | POA: Diagnosis present

## 2024-01-14 DIAGNOSIS — E860 Dehydration: Secondary | ICD-10-CM | POA: Diagnosis present

## 2024-01-14 DIAGNOSIS — E66812 Obesity, class 2: Secondary | ICD-10-CM | POA: Diagnosis present

## 2024-01-14 DIAGNOSIS — N179 Acute kidney failure, unspecified: Secondary | ICD-10-CM | POA: Diagnosis present

## 2024-01-14 DIAGNOSIS — F17210 Nicotine dependence, cigarettes, uncomplicated: Secondary | ICD-10-CM | POA: Diagnosis present

## 2024-01-14 HISTORY — PX: IRRIGATION AND DEBRIDEMENT ABSCESS: SHX5252

## 2024-01-14 LAB — CBC
HCT: 43 % (ref 39.0–52.0)
Hemoglobin: 13.8 g/dL (ref 13.0–17.0)
MCH: 28.8 pg (ref 26.0–34.0)
MCHC: 32.1 g/dL (ref 30.0–36.0)
MCV: 89.8 fL (ref 80.0–100.0)
Platelets: 312 10*3/uL (ref 150–400)
RBC: 4.79 MIL/uL (ref 4.22–5.81)
RDW: 14.3 % (ref 11.5–15.5)
WBC: 12.7 10*3/uL — ABNORMAL HIGH (ref 4.0–10.5)
nRBC: 0 % (ref 0.0–0.2)

## 2024-01-14 LAB — BASIC METABOLIC PANEL
Anion gap: 11 (ref 5–15)
BUN: 11 mg/dL (ref 6–20)
CO2: 20 mmol/L — ABNORMAL LOW (ref 22–32)
Calcium: 8.8 mg/dL — ABNORMAL LOW (ref 8.9–10.3)
Chloride: 102 mmol/L (ref 98–111)
Creatinine, Ser: 1.31 mg/dL — ABNORMAL HIGH (ref 0.61–1.24)
GFR, Estimated: >60 mL/min (ref >60)
Glucose, Bld: 90 mg/dL (ref 70–99)
Potassium: 4.3 mmol/L (ref 3.5–5.1)
Sodium: 133 mmol/L — ABNORMAL LOW (ref 135–145)

## 2024-01-14 SURGERY — IRRIGATION AND DEBRIDEMENT ABSCESS
Anesthesia: General | Site: Scrotum | Laterality: Right

## 2024-01-14 MED ORDER — FENTANYL CITRATE (PF) 100 MCG/2ML IJ SOLN
INTRAMUSCULAR | Status: AC
  Filled 2024-01-14: qty 2

## 2024-01-14 MED ORDER — OXYCODONE HCL 5 MG/5ML PO SOLN: 5.0000 mg | Freq: Once | ORAL | Status: DC | PRN

## 2024-01-14 MED ORDER — SUGAMMADEX SODIUM 200 MG/2ML IV SOLN
INTRAVENOUS | Status: AC
  Filled 2024-01-14: qty 2

## 2024-01-14 MED ORDER — DEXAMETHASONE SODIUM PHOSPHATE 10 MG/ML IJ SOLN
INTRAMUSCULAR | Status: AC
  Filled 2024-01-14: qty 1

## 2024-01-14 MED ORDER — STERILE WATER FOR IRRIGATION IR SOLN
Status: DC | PRN
  Administered 2024-01-14: 1000 mL

## 2024-01-14 MED ORDER — LIDOCAINE HCL (CARDIAC) PF 100 MG/5ML IV SOSY
PREFILLED_SYRINGE | INTRAVENOUS | Status: DC | PRN
Start: 2024-01-14 — End: 2024-01-14
  Administered 2024-01-14: 100 mg via INTRAVENOUS

## 2024-01-14 MED ORDER — LACTATED RINGERS IV SOLN: INTRAVENOUS | Status: DC

## 2024-01-14 MED ORDER — NICOTINE 21 MG/24HR TD PT24
21.0000 mg | MEDICATED_PATCH | Freq: Every day | TRANSDERMAL | Status: DC
  Administered 2024-01-14 – 2024-01-16 (×3): 21 mg via TRANSDERMAL
  Filled 2024-01-14 (×4): qty 1

## 2024-01-14 MED ORDER — OXYCODONE HCL 5 MG PO TABS: 5.0000 mg | ORAL_TABLET | Freq: Once | ORAL | Status: DC | PRN

## 2024-01-14 MED ORDER — SUCCINYLCHOLINE CHLORIDE 200 MG/10ML IV SOSY
PREFILLED_SYRINGE | INTRAVENOUS | Status: DC | PRN
  Administered 2024-01-14: 140 mg via INTRAVENOUS

## 2024-01-14 MED ORDER — PROPOFOL 500 MG/50ML IV EMUL
INTRAVENOUS | Status: DC | PRN
Start: 2024-01-14 — End: 2024-01-14
  Administered 2024-01-14: 150 ug/kg/min via INTRAVENOUS

## 2024-01-14 MED ORDER — DOCUSATE SODIUM 100 MG PO CAPS
100.0000 mg | ORAL_CAPSULE | Freq: Two times a day (BID) | ORAL | Status: DC
  Administered 2024-01-14 – 2024-01-16 (×4): 100 mg via ORAL
  Filled 2024-01-14 (×4): qty 1

## 2024-01-14 MED ORDER — ROCURONIUM BROMIDE 100 MG/10ML IV SOLN
INTRAVENOUS | Status: DC | PRN
  Administered 2024-01-14: 30 mg via INTRAVENOUS
  Administered 2024-01-14: 10 mg via INTRAVENOUS

## 2024-01-14 MED ORDER — FENTANYL CITRATE (PF) 100 MCG/2ML IJ SOLN
INTRAMUSCULAR | Status: DC | PRN
  Administered 2024-01-14 (×2): 100 ug via INTRAVENOUS

## 2024-01-14 MED ORDER — VANCOMYCIN HCL 1250 MG/250ML IV SOLN: 1250.0000 mg | Freq: Two times a day (BID) | INTRAVENOUS | Status: DC

## 2024-01-14 MED ORDER — VANCOMYCIN HCL 2000 MG/400ML IV SOLN
2000.0000 mg | Freq: Once | INTRAVENOUS | Status: AC
  Administered 2024-01-14: 2000 mg via INTRAVENOUS
  Filled 2024-01-14 (×2): qty 400

## 2024-01-14 MED ORDER — ACETAMINOPHEN 325 MG PO TABS: 650.0000 mg | ORAL_TABLET | ORAL | Status: DC | PRN

## 2024-01-14 MED ORDER — HYDROMORPHONE HCL 1 MG/ML IJ SOLN: 0.2500 mg | INTRAMUSCULAR | Status: DC | PRN

## 2024-01-14 MED ORDER — ROCURONIUM BROMIDE 10 MG/ML (PF) SYRINGE
PREFILLED_SYRINGE | INTRAVENOUS | Status: AC
  Filled 2024-01-14: qty 10

## 2024-01-14 MED ORDER — DEXMEDETOMIDINE HCL IN NACL 200 MCG/50ML IV SOLN
INTRAVENOUS | Status: DC | PRN
  Administered 2024-01-14: 12 ug via INTRAVENOUS

## 2024-01-14 MED ORDER — ONDANSETRON HCL 4 MG/2ML IJ SOLN: 4.0000 mg | INTRAMUSCULAR | Status: DC | PRN

## 2024-01-14 MED ORDER — PROPOFOL 10 MG/ML IV BOLUS
INTRAVENOUS | Status: AC
  Filled 2024-01-14: qty 20

## 2024-01-14 MED ORDER — SUCCINYLCHOLINE CHLORIDE 200 MG/10ML IV SOSY
PREFILLED_SYRINGE | INTRAVENOUS | Status: AC
  Filled 2024-01-14: qty 10

## 2024-01-14 MED ORDER — HYDROMORPHONE HCL 1 MG/ML IJ SOLN
0.5000 mg | INTRAMUSCULAR | Status: DC | PRN
  Administered 2024-01-14 – 2024-01-15 (×5): 1 mg via INTRAVENOUS
  Filled 2024-01-14 (×5): qty 1

## 2024-01-14 MED ORDER — ESMOLOL HCL 100 MG/10ML IV SOLN
INTRAVENOUS | Status: DC | PRN
  Administered 2024-01-14: 50 mg via INTRAVENOUS

## 2024-01-14 MED ORDER — DEXMEDETOMIDINE HCL IN NACL 80 MCG/20ML IV SOLN
INTRAVENOUS | Status: AC
  Filled 2024-01-14: qty 20

## 2024-01-14 MED ORDER — SODIUM CHLORIDE 0.9 % IV SOLN
1.0000 g | Freq: Once | INTRAVENOUS | Status: AC
  Administered 2024-01-14: 1 g via INTRAVENOUS
  Filled 2024-01-14: qty 10

## 2024-01-14 MED ORDER — CHLORHEXIDINE GLUCONATE 0.12 % MT SOLN
15.0000 mL | Freq: Once | OROMUCOSAL | Status: AC
  Administered 2024-01-14: 15 mL via OROMUCOSAL

## 2024-01-14 MED ORDER — MIDAZOLAM HCL 2 MG/2ML IJ SOLN
INTRAMUSCULAR | Status: AC
Start: 2024-01-14 — End: ?
  Filled 2024-01-14: qty 2

## 2024-01-14 MED ORDER — OXYCODONE HCL 5 MG PO TABS
5.0000 mg | ORAL_TABLET | ORAL | Status: DC | PRN
  Administered 2024-01-14 – 2024-01-16 (×6): 5 mg via ORAL
  Filled 2024-01-14 (×9): qty 1

## 2024-01-14 MED ORDER — MORPHINE SULFATE (PF) 4 MG/ML IV SOLN
4.0000 mg | Freq: Once | INTRAVENOUS | Status: AC
  Administered 2024-01-14: 4 mg via INTRAVENOUS
  Filled 2024-01-14: qty 1

## 2024-01-14 MED ORDER — SUGAMMADEX SODIUM 200 MG/2ML IV SOLN
INTRAVENOUS | Status: DC | PRN
Start: 2024-01-14 — End: 2024-01-14
  Administered 2024-01-14: 250 mg via INTRAVENOUS

## 2024-01-14 MED ORDER — LIDOCAINE HCL (PF) 2 % IJ SOLN
INTRAMUSCULAR | Status: AC
  Filled 2024-01-14: qty 5

## 2024-01-14 MED ORDER — VANCOMYCIN HCL IN DEXTROSE 1-5 GM/200ML-% IV SOLN
1000.0000 mg | Freq: Once | INTRAVENOUS | Status: DC
Start: 2024-01-14 — End: 2024-01-14

## 2024-01-14 MED ORDER — ONDANSETRON HCL 4 MG/2ML IJ SOLN
INTRAMUSCULAR | Status: AC
  Filled 2024-01-14: qty 2

## 2024-01-14 MED ORDER — VANCOMYCIN HCL 10 G IV SOLR
1250.0000 mg | Freq: Two times a day (BID) | INTRAVENOUS | Status: DC
  Administered 2024-01-15 – 2024-01-16 (×3): 1250 mg via INTRAVENOUS
  Filled 2024-01-14: qty 12.5
  Filled 2024-01-14: qty 1250
  Filled 2024-01-14: qty 12.5
  Filled 2024-01-14: qty 1250
  Filled 2024-01-14: qty 12.5
  Filled 2024-01-14: qty 1250

## 2024-01-14 MED ORDER — PIPERACILLIN-TAZOBACTAM 3.375 G IVPB
3.3750 g | Freq: Three times a day (TID) | INTRAVENOUS | Status: DC
  Administered 2024-01-14 – 2024-01-16 (×6): 3.375 g via INTRAVENOUS
  Filled 2024-01-14 (×5): qty 50

## 2024-01-14 MED ORDER — PROPOFOL 10 MG/ML IV BOLUS
INTRAVENOUS | Status: DC | PRN
  Administered 2024-01-14: 200 mg via INTRAVENOUS

## 2024-01-14 MED ORDER — MORPHINE SULFATE (PF) 2 MG/ML IV SOLN: 2.0000 mg | INTRAVENOUS | Status: DC | PRN

## 2024-01-14 MED ORDER — ONDANSETRON HCL 4 MG/2ML IJ SOLN: 4.0000 mg | Freq: Once | INTRAMUSCULAR | Status: DC | PRN

## 2024-01-14 MED ORDER — 0.9 % SODIUM CHLORIDE (POUR BTL) OPTIME
TOPICAL | Status: DC | PRN
  Administered 2024-01-14: 1000 mL

## 2024-01-14 MED ORDER — ONDANSETRON HCL 4 MG/2ML IJ SOLN
INTRAMUSCULAR | Status: DC | PRN
  Administered 2024-01-14: 4 mg via INTRAVENOUS

## 2024-01-14 MED ORDER — PIPERACILLIN-TAZOBACTAM 3.375 G IVPB 30 MIN
3.3750 g | Freq: Once | INTRAVENOUS | Status: AC
  Administered 2024-01-14: 3.375 g via INTRAVENOUS
  Filled 2024-01-14: qty 50

## 2024-01-14 MED ORDER — SODIUM CHLORIDE 0.45 % IV SOLN: INTRAVENOUS | Status: AC

## 2024-01-14 MED ORDER — DROPERIDOL 2.5 MG/ML IJ SOLN: 0.6250 mg | Freq: Once | INTRAMUSCULAR | Status: DC | PRN

## 2024-01-14 SURGICAL SUPPLY — 28 items
BAG COUNTER SPONGE SURGICOUNT (BAG) IMPLANT
BNDG GAUZE DERMACEA FLUFF 4 (GAUZE/BANDAGES/DRESSINGS) ×1 IMPLANT
BRIEF MESH DISP LRG (UNDERPADS AND DIAPERS) IMPLANT
COVER SURGICAL LIGHT HANDLE (MISCELLANEOUS) ×1 IMPLANT
DERMABOND ADVANCED .7 DNX12 (GAUZE/BANDAGES/DRESSINGS) IMPLANT
DRAPE LAPAROTOMY T 98X78 PEDS (DRAPES) ×1 IMPLANT
ELECT NDL TIP 2.8 STRL (NEEDLE) ×1 IMPLANT
ELECT NEEDLE TIP 2.8 STRL (NEEDLE) IMPLANT
ELECT REM PT RETURN 15FT ADLT (MISCELLANEOUS) ×1 IMPLANT
GAUZE PAD ABD 8X10 STRL (GAUZE/BANDAGES/DRESSINGS) IMPLANT
GLOVE SURG LX STRL 7.5 STRW (GLOVE) ×1 IMPLANT
GOWN STRL REUS W/ TWL XL LVL3 (GOWN DISPOSABLE) ×1 IMPLANT
KIT BASIN OR (CUSTOM PROCEDURE TRAY) ×1 IMPLANT
KIT TURNOVER KIT A (KITS) IMPLANT
NDL HYPO 22X1.5 SAFETY MO (MISCELLANEOUS) IMPLANT
NEEDLE HYPO 22X1.5 SAFETY MO (MISCELLANEOUS) IMPLANT
NS IRRIG 1000ML POUR BTL (IV SOLUTION) ×1 IMPLANT
PACK GENERAL/GYN (CUSTOM PROCEDURE TRAY) ×1 IMPLANT
SPIKE FLUID TRANSFER (MISCELLANEOUS) ×1 IMPLANT
SUPPORTER AHLETIC TETRA LG (SOFTGOODS) ×1 IMPLANT
SUT CHROMIC 3 0 SH 27 (SUTURE) ×2 IMPLANT
SUT MNCRL AB 4-0 PS2 18 (SUTURE) IMPLANT
SUT PROLENE 4-0 RB1 .5 CRCL 36 (SUTURE) IMPLANT
SUT VIC AB 2-0 UR5 27 (SUTURE) IMPLANT
SUT VICRYL 0 TIES 12 18 (SUTURE) IMPLANT
SYR CONTROL 10ML LL (SYRINGE) IMPLANT
TOWEL OR 17X26 10 PK STRL BLUE (TOWEL DISPOSABLE) ×2 IMPLANT
WATER STERILE IRR 1000ML POUR (IV SOLUTION) ×1 IMPLANT

## 2024-01-14 NOTE — Transfer of Care (Signed)
 Immediate Anesthesia Transfer of Care Note  Patient: Ryan Lester  Procedure(s) Performed: IRRIGATION AND DEBRIDEMENT RIGHT SIDE SCROTAL ABCESS (Right: Scrotum)  Patient Location: PACU  Anesthesia Type:General  Level of Consciousness: awake, alert , and oriented  Airway & Oxygen Therapy: Patient Spontanous Breathing  Post-op Assessment: Post -op Vital signs reviewed and stable  Post vital signs: Reviewed and stable  Last Vitals:  Vitals Value Taken Time  BP 156/93 01/14/24 1431  Temp    Pulse 102 01/14/24 1436  Resp 12 01/14/24 1435  SpO2 89 % 01/14/24 1436  Vitals shown include unfiled device data.  Last Pain:  Vitals:   01/14/24 1307  TempSrc: Oral  PainSc: 10-Worst pain ever         Complications: No notable events documented.

## 2024-01-14 NOTE — ED Triage Notes (Signed)
 Pt c/o abscess to R inguinal area for five days. Endorses recent fever yesterday while at [facility], but had to leave d/t wait time. Reports hx of frequent abscesses needing drained.
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
## 2024-01-14 NOTE — Anesthesia Postprocedure Evaluation (Signed)
 Anesthesia Post Note  Patient: Ryan Lester  Procedure(s) Performed: IRRIGATION AND DEBRIDEMENT RIGHT SIDE SCROTAL ABCESS (Right: Scrotum)     Patient location during evaluation: PACU Anesthesia Type: General Level of consciousness: awake and alert and oriented Pain management: pain level controlled Vital Signs Assessment: post-procedure vital signs reviewed and stable Respiratory status: spontaneous breathing, nonlabored ventilation and respiratory function stable Cardiovascular status: blood pressure returned to baseline and stable Postop Assessment: no apparent nausea or vomiting Anesthetic complications: no   No notable events documented.  Last Vitals:  Vitals:   01/14/24 1445 01/14/24 1500  BP: (!) 141/102 (!) 131/112  Pulse: 98 98  Resp: 11 16  Temp:    SpO2: 95% 97%    Last Pain:  Vitals:   01/14/24 1500  TempSrc:   PainSc: 0-No pain                 Brenda Cowher A.

## 2024-01-14 NOTE — ED Provider Notes (Signed)
 [place] EMERGENCY DEPARTMENT AT [facility] Provider Note   CSN: [id number] Arrival date & time: 01/14/24  4098     History  Chief Complaint  Patient presents with   Abscess    Ryan Lester is a 28 y.o. male.   Abscess    Patient has a history of prior abscesses requiring incision and drainage.  He presents to the ED for evaluation recurrent abscess.  Patient was seen in the emergency room initially on February 2.  He was treated for a scrotal abscess.  Patient was admitted to the hospital and discharged on the fourth.  He had surgery by Dr. Jennette Bill.  Patient was discharged on Augmentin.  Patient presented back to the ED on February 23.  At that time he was noticing increasing swelling.  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  Patient's ultrasound on the 23rd showed thickening of the right spermatic cord but no focal fluid collection.  Patient was supposed to have a follow-up CT scan but he left before having that reviewed.  Patient then presented to the [facility] emergency room yesterday and had an ultrasound at triage that showed a heterogeneous collection lateral to the right scrotum.  It was measuring 5.8 cm in diameter.  Patient left yesterday from the emergency room before being evaluated.  He presents today for the same symptoms  Home Medications Prior to Admission medications   Medication Sig Start Date End Date Taking? Authorizing Provider  HYDROcodone-acetaminophen (NORCO/VICODIN) 5-325 MG tablet Take 1-2 tablets by mouth every 6 (six) hours as needed for moderate pain (pain score 4-6) or severe pain (pain score 7-10). 12/22/23   Sherryll Burger, Pratik D, DO  albuterol (PROVENTIL HFA;VENTOLIN HFA) 108 (90 Base) MCG/ACT inhaler Inhale 1-2 puffs into the lungs every 6 (six) hours as needed for wheezing. 02/25/18 04/10/20  Rolland Porter, MD      Allergies    Sulfa antibiotics    Review of Systems   Review of Systems  Physical  Exam Updated Vital Signs BP 136/88   Pulse 89   Temp 97.8 F (36.6 C) (Oral)   Resp 18   Ht 1.829 m (6')   Wt 122.5 kg   SpO2 95%   BMI 36.62 kg/m  Physical Exam Vitals and nursing note reviewed.  Constitutional:      General: He is not in acute distress.    Appearance: He is well-developed.  HENT:     Head: Normocephalic and atraumatic.     Right Ear: External ear normal.     Left Ear: External ear normal.  Eyes:     General: No scleral icterus.       Right eye: No discharge.        Left eye: No discharge.     Conjunctiva/sclera: Conjunctivae normal.  Neck:     Trachea: No tracheal deviation.  Cardiovascular:     Rate and Rhythm: Normal rate.  Pulmonary:     Effort: Pulmonary effort is normal. No respiratory distress.     Breath sounds: No stridor.  Abdominal:     General: There is no distension.  Genitourinary:    Comments: Tenderness palpation above the patient's wound in his right hemiscrotum, induration, some drainage noted from the inferior open wound on his right hemiscrotum Musculoskeletal:        General: No swelling or deformity.     Cervical back: Neck supple.  Skin:    General: Skin is warm  and dry.     Findings: No rash.  Neurological:     Mental Status: He is alert. Mental status is at baseline.     Cranial Nerves: No dysarthria or facial asymmetry.     Motor: No seizure activity.     ED Results / Procedures / Treatments   Labs (all labs ordered are listed, but only abnormal results are displayed) Labs Reviewed  CBC - Abnormal; Notable for the following components:      Result Value   WBC 12.7 (*)    All other components within normal limits  BASIC METABOLIC PANEL - Abnormal; Notable for the following components:   Sodium 133 (*)    CO2 20 (*)    Creatinine, Ser 1.31 (*)    Calcium 8.8 (*)    All other components within normal limits    EKG None  Radiology US SCROTUM W/DOPPLER Result Date: 01/13/2024 CLINICAL DATA:  Testicular  abscess. EXAM: SCROTAL ULTRASOUND DOPPLER ULTRASOUND OF THE TESTICLES TECHNIQUE: Complete ultrasound examination of the testicles, epididymis, and other scrotal structures was performed. Color and spectral Doppler ultrasound were also utilized to evaluate blood flow to the testicles. COMPARISON:  Scrotal ultrasound 01/10/2024.  CT pelvis 12/20/2023 FINDINGS: Right testicle Measurements: 5 x 2.9 x 3 cm. Testicular parenchyma appears homogeneous. Color flow Doppler signal is homogeneous and normal. No mass or testicular collection identified. There is diffuse scrotal skin thickening. Corresponding to the area of pain and swelling lateral to the right scrotum, there is a heterogeneous collection measuring 5.8 by 1.7 x 2.2 cm. This contains fluid and heterogeneous echotexture material. Peripheral flow on color flow Doppler imaging without central flow. This likely corresponds to the abscess seen in the soft tissues of the right hemiscrotum on prior CT. Left testicle Measurements: 4.4 x 2.8 x 3.1 cm. Normal parenchymal echotexture. No mass or collection is identified. Homogeneous color flow Doppler signal. Right epididymis:  Normal in size and appearance. Left epididymis:  Normal in size and appearance. Hydrocele:  Bilateral scrotal hydroceles. Varicocele:  Small bilateral varicoceles. Pulsed Doppler interrogation of both testes demonstrates normal low resistance arterial and venous waveforms bilaterally. IMPRESSION: 1. No evidence of testicular mass or torsion. 2. Diffuse scrotal skin thickening. 3. Heterogeneous collection lateral to the right scrotum corresponding to area of pain. This measures 5.8 cm maximal diameter and likely corresponds to abscess seen on prior CT. Electronically Signed   By: Burman Nieves M.D.   On: 01/13/2024 16:25    Procedures Procedures    Medications Ordered in ED Medications  cefTRIAXone (ROCEPHIN) 1 g in sodium chloride 0.9 % 100 mL IVPB (1 g Intravenous New Bag/Given 01/14/24  1221)  morphine (PF) 4 MG/ML injection 4 mg (4 mg Intravenous Given 01/14/24 1103)    ED Course/ Medical Decision Making/ A&P Clinical Course as of 01/14/24 1238  Thu Jan 14, 2024  1045 Imaging tests reviewed from yesterday.  Patient did have evidence of recurrent scrotal abscess [JK]  1045 Case discussed with NP [place].  Will evaluate pt in the ED [JK]  1203 Labs reviewed.  White blood cell count is elevated at 12.7.  Metabolic panel shows stable renal function [JK]  1237 CO2(!): 20 [JK]    Clinical Course User Index [JK] Linwood Dibbles, MD                                 Medical Decision Making Problems Addressed: Scrotal abscess: acute  illness or injury that poses a threat to life or bodily functions  Amount and/or Complexity of Data Reviewed Labs: ordered. Decision-making details documented in ED Course.  Risk Prescription drug management. Decision regarding hospitalization.   Patient has had issues with recurrent abscesses requiring drainage.  Patient was recently admitted to the hospital for systemic symptoms of infection associated with an abscess.  Patient has not been able to follow-up with urology as recommended.  He started having increasing pain and swelling recently.  He was seen in the emergency room twice this past week but did not stay for completion of his evaluation.  Patient's ultrasound yesterday showed evidence of recurring abscess.  Patient does have significant focal tenderness in that area.  No signs of fever or systemic infection at this time.  I have spoken with NP [facility].  They will come evaluate the patient.  Plan is for surgical intervention in the OR        Final Clinical Impression(s) / ED Diagnoses Final diagnoses:  Scrotal abscess    Rx / DC Orders ED Discharge Orders     None         Linwood Dibbles, MD 01/14/24 1238

## 2024-01-14 NOTE — Anesthesia Preprocedure Evaluation (Addendum)
 Anesthesia Evaluation  Patient identified by MRN, date of birth, ID band Patient awake    Reviewed: Allergy & Precautions, NPO status , Patient's Chart, lab work & pertinent test results  Airway Mallampati: II  TM Distance: >3 FB     Dental no notable dental hx.    Pulmonary Current SmokerPatient did not abstain from smoking.   Pulmonary exam normal breath sounds clear to auscultation       Cardiovascular negative cardio ROS Normal cardiovascular exam Rhythm:Regular Rate:Normal     Neuro/Psych Hypersomnia  negative psych ROS   GI/Hepatic negative GI ROS, Neg liver ROS,,,  Endo/Other  Obesity  Renal/GU negative Renal ROS  negative genitourinary   Musculoskeletal   Abdominal  (+) + obese  Peds  Hematology negative hematology ROS (+)   Anesthesia Other Findings   Reproductive/Obstetrics Scrotal abscess                             Anesthesia Physical Anesthesia Plan  ASA: 2 and emergent  Anesthesia Plan: General   Post-op Pain Management: Dilaudid IV   Induction: Intravenous, Rapid sequence and Cricoid pressure planned  PONV Risk Score and Plan: 3 and Treatment may vary due to age or medical condition, Midazolam, Ondansetron and Dexamethasone  Airway Management Planned: Oral ETT  Additional Equipment: None  Intra-op Plan:   Post-operative Plan: Extubation in OR  Informed Consent: I have reviewed the patients History and Physical, chart, labs and discussed the procedure including the risks, benefits and alternatives for the proposed anesthesia with the patient or authorized representative who has indicated his/her understanding and acceptance.     Dental advisory given  Plan Discussed with: Anesthesiologist and CRNA  Anesthesia Plan Comments:        Anesthesia Quick Evaluation

## 2024-01-14 NOTE — Anesthesia Procedure Notes (Signed)
 Procedure Name: Intubation Date/Time: 01/14/2024 1:44 PM  Performed by: Alwyn Ren, CRNAPre-anesthesia Checklist: Patient identified, Emergency Drugs available, Suction available and Patient being monitored Patient Re-evaluated:Patient Re-evaluated prior to induction Oxygen Delivery Method: Circle system utilized Preoxygenation: Pre-oxygenation with 100% oxygen Induction Type: IV induction Ventilation: Mask ventilation without difficulty Laryngoscope Size: Glidescope and 3 Grade View: Grade I Tube type: Oral Tube size: 7.0 mm Number of attempts: 1 Airway Equipment and Method: Stylet and Oral airway Placement Confirmation: ETT inserted through vocal cords under direct vision, positive ETCO2 and breath sounds checked- equal and bilateral Secured at: 23 cm Tube secured with: Tape Dental Injury: Teeth and Oropharynx as per pre-operative assessment

## 2024-01-14 NOTE — Progress Notes (Signed)
 Pharmacy Antibiotic Note  Ryan Lester is a 28 y.o. male admitted on 01/14/2024 with  scrotal abscess .  PM[facility] was admitted earlier this month for the same issue and discharged with two weeks of antibiotics. Pt reports non compliance. S/p I&D on 2/27.  Pharmacy has been consulted for piperacillin/tazobactam and vancomycin dosing.  Today, 01/14/24 WBC slightly elevated SCr slightly elevated Afebrile  Plan: Piperacillin/tazobactam 3.375 g IV q8h EI Vancomycin 2000 mg loading dose followed by 1250 mg IV q12h for estimated AUC of 505 Goal AUC 400-550. Check levels as needed Follow renal function, culture data  Height: 6' (182.9 cm) Weight: 122.5 kg (270 lb) IBW/kg (Calculated) : 77.6  Temp (24hrs), Avg:99.3 F (37.4 C), Min:97.8 F (36.6 C), Max:102.4 F (39.1 C)  Recent Labs  Lab 01/10/24 1255 01/13/24 1305 01/13/24 1316 01/14/24 1051  WBC 12.6* 10.2  --  12.7*  CREATININE 1.12 1.31*  --  1.31*  LATICACIDVEN  --   --  0.8  --     Estimated Creatinine Clearance: 113.5 mL/min (A) (by C-G formula based on SCr of 1.31 mg/dL (H)).    Allergies  Allergen Reactions   Sulfa Antibiotics Itching and Rash   Antimicrobials this admission: Ceftriaxone x1 pre-op on 2/27 vancomycin 2/27 >>  Piperacillin/tazobactam 2/27 >>  Dose adjustments this admission:  Microbiology results: 2/27 Surgical wound:   Cindi Carbon, PharmD 01/14/2024 4:16 PM

## 2024-01-14 NOTE — Plan of Care (Signed)
  Problem: Education: Goal: Knowledge of General Education information will improve Description: Including pain rating scale, medication(s)/side effects and non-pharmacologic comfort measures Outcome: Progressing   Problem: Health Behavior/Discharge Planning: Goal: Ability to manage health-related needs will improve Outcome: Progressing   Problem: Clinical Measurements: Goal: Ability to maintain clinical measurements within normal limits will improve Outcome: Progressing Goal: Will remain free from infection Outcome: Progressing Goal: Diagnostic test results will improve Outcome: Progressing Goal: Respiratory complications will improve Outcome: Progressing Goal: Cardiovascular complication will be avoided Outcome: Progressing   Problem: Activity: Goal: Risk for activity intolerance will decrease Outcome: Progressing   Problem: Nutrition: Goal: Adequate nutrition will be maintained Outcome: Progressing   Problem: Coping: Goal: Level of anxiety will decrease Outcome: Progressing   Problem: Elimination: Goal: Will not experience complications related to bowel motility Outcome: Progressing Goal: Will not experience complications related to urinary retention Outcome: Progressing   Problem: Pain Managment: Goal: General experience of comfort will improve and/or be controlled Outcome: Progressing   Problem: Safety: Goal: Ability to remain free from injury will improve Outcome: Progressing   Problem: Skin Integrity: Goal: Risk for impaired skin integrity will decrease Outcome: Progressing   Problem: Clinical Measurements: Goal: Ability to avoid or minimize complications of infection will improve Outcome: Progressing   Problem: Skin Integrity: Goal: Skin integrity will improve Outcome: Progressing   Problem: Education: Goal: Knowledge of General Education information will improve Description: Including pain rating scale, medication(s)/side effects and  non-pharmacologic comfort measures Outcome: Progressing   Problem: Health Behavior/Discharge Planning: Goal: Ability to manage health-related needs will improve Outcome: Progressing   Problem: Clinical Measurements: Goal: Ability to maintain clinical measurements within normal limits will improve Outcome: Progressing Goal: Will remain free from infection Outcome: Progressing Goal: Diagnostic test results will improve Outcome: Progressing Goal: Respiratory complications will improve Outcome: Progressing Goal: Cardiovascular complication will be avoided Outcome: Progressing   Problem: Activity: Goal: Risk for activity intolerance will decrease Outcome: Progressing   Problem: Nutrition: Goal: Adequate nutrition will be maintained Outcome: Progressing   Problem: Coping: Goal: Level of anxiety will decrease Outcome: Progressing   Problem: Elimination: Goal: Will not experience complications related to bowel motility Outcome: Progressing Goal: Will not experience complications related to urinary retention Outcome: Progressing   Problem: Pain Managment: Goal: General experience of comfort will improve and/or be controlled Outcome: Progressing   Problem: Safety: Goal: Ability to remain free from injury will improve Outcome: Progressing   Problem: Skin Integrity: Goal: Risk for impaired skin integrity will decrease Outcome: Progressing
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
## 2024-01-14 NOTE — Op Note (Signed)
 OPERATIVE NOTE   Patient Name: Ryan Lester  MRN: [id number]   Date of Procedure: 01/14/24  Preoperative diagnosis:  Right scrotal abscess  Postoperative diagnosis:  Right scrotal abscess  Procedure:  Incision and drainage of right scrotal abscess  Attending: Milderd Meager, MD  Anesthesia: General  Estimated blood loss: 10 ml  Fluids: Per anesthesia record  Drains: None  Specimens: Wound culture sent for aerobic and anaerobic cx  Antibiotics: Rocephin IV received pre-operatively  Findings: 5 cm abscess of the right lateral scrotal wall  Indications:  28 year old male presents for surgical management of right scrotal wall abscess.  He underwent incision and drainage of a scrotal abscess on 12/20/2023.  Wound cultures grew corynebacterium and actinomyces species.  He was treated with IV antibiotics and discharged on Augmentin.  He reports that he initially was doing well but admits that he was not packing the wound as aggressively as directed.  He the wound apparently closed within several days of discharge.  He has recently noted increased swelling and pain in the area.  He presented to the emergency room last night for evaluation.  Scrotal ultrasound showed a 5.8 cm heterogeneous collection of fluid lateral to the right scrotum consistent with abscess.  Blood cell count elevated at 12.7.  No fever or chills.  He presents now for incision and drainage of the right scrotal abscess under general anesthesia.  The procedure and potential risk was discussed with the patient in detail.  He understands and wishes to proceed as described.  Description of Procedure:  The patient received IV Rocephin in the emergency department.  He was taken to the operating room suite and properly identified.  After successful induction of a general anesthetic, the patient was placed in the dorsolithotomy position.  The patient's genital area was prepped and draped in sterile fashion.  A  preoperative timeout was performed.  Examination demonstrated a 5 cm fluctuant area on the right lateral scrotal wall consistent with an abscess.  This was slightly superior to the prior incision and drainage site which was closed.  Some mild induration was noted superiorly into the upper scrotum.  A longitudinal incision was made over the fluctuant area.  There was immediate return of a large amount of purulent fluid.  Aerobic and anaerobic cultures were obtained.  The incision was actually carried down to incorporate the prior drainage site.  Fingertip dissection was used to open the area.  No other pockets of fluid were identified superiorly laterally or medially.  The wound was then irrigated with copious amounts of normal saline.  Hemostasis was obtained with the cautery.  The wound bed appeared healthy without times of necrosis.   The wound was packed with Kerlix gauze.  The patient was then extubated and taken to the postanesthesia care unit in stable condition.  Complications: None  Condition: Stable, extubated, transferred to PACU  Plan:  Patient will be admitted to medicine for IV antibiotics and pain control. Await culture results. Prior cultures grew  corynebacterium and actinomyces
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
## 2024-01-14 NOTE — Interval H&P Note (Signed)
 History and Physical Interval Note:  01/14/2024 1:07 PM  Ryan Lester  has presented today for surgery, with the diagnosis of SCROTAL ABSCESS.  The various methods of treatment have been discussed with the patient and family. After consideration of risks, benefits and other options for treatment, the patient has consented to  Procedure(s): IRRIGATION AND DEBRIDEMENT RIGHT SIDE SCROTAL AND INGUINAL ABSCESS (Right) as a surgical intervention.  The patient's history has been reviewed, patient examined, no change in status, stable for surgery.  I have reviewed the patient's chart and labs.  Questions were answered to the patient's satisfaction.     Di Kindle

## 2024-01-15 ENCOUNTER — Encounter (HOSPITAL_COMMUNITY): Payer: Self-pay | Admitting: Urology

## 2024-01-15 DIAGNOSIS — F17210 Nicotine dependence, cigarettes, uncomplicated: Secondary | ICD-10-CM

## 2024-01-15 LAB — BASIC METABOLIC PANEL
Anion gap: 9 (ref 5–15)
BUN: 12 mg/dL (ref 6–20)
CO2: 23 mmol/L (ref 22–32)
Calcium: 9.1 mg/dL (ref 8.9–10.3)
Chloride: 103 mmol/L (ref 98–111)
Creatinine, Ser: 1.15 mg/dL (ref 0.61–1.24)
GFR, Estimated: >60 mL/min (ref >60)
Glucose, Bld: 157 mg/dL — ABNORMAL HIGH (ref 70–99)
Potassium: 4 mmol/L (ref 3.5–5.1)
Sodium: 135 mmol/L (ref 135–145)

## 2024-01-15 LAB — CBC
HCT: 41.3 % (ref 39.0–52.0)
Hemoglobin: 13 g/dL (ref 13.0–17.0)
MCH: 28.6 pg (ref 26.0–34.0)
MCHC: 31.5 g/dL (ref 30.0–36.0)
MCV: 90.8 fL (ref 80.0–100.0)
Platelets: 303 10*3/uL (ref 150–400)
RBC: 4.55 MIL/uL (ref 4.22–5.81)
RDW: 13.9 % (ref 11.5–15.5)
WBC: 14.7 10*3/uL — ABNORMAL HIGH (ref 4.0–10.5)
nRBC: 0 % (ref 0.0–0.2)
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
## 2024-01-15 NOTE — Progress Notes (Signed)
 PROGRESS NOTE  Ryan Lester ZOX:[id number] DOB: 1996/04/20 DOA: 01/14/2024 PCP: Pcp, No   LOS: 1 day   Brief Narrative / Interim history: 28 years old male with past medical history of scrotal abscess recently admitted this month on February 2nd  when he had I&D of abscess presented to the hospital with evaluation of recurrent abscess.  This time patient noticed increasing swelling, redness around the scrotal area for few days.  Has a history of the same few weeks ago, underwent I&D, cultures grew Corynebacterium as well as actinomyces.  He was sent home on Augmentin, but per chart review could not tolerate dressing changes at home, also return to work and infection recurred.  Subjective / 24h Interval events: Doing well this morning, denies any significant pain at rest  Assesement and Plan: Principal problem Right scrotal abscess -recurrent, status post I&D by urology 2/27.  Continue to monitor cultures, ID consulted.  Keep on broad-spectrum antibiotics for now  Active problems Mild AKI-creatinine normalized with fluids  Obesity, class II-BMI 36, he would benefit from weight loss  Tobacco use-recommend cessation  Scheduled Meds:  docusate sodium  100 mg Oral BID   nicotine  21 mg Transdermal Daily   Continuous Infusions:  sodium chloride 40 mL/hr at 01/14/24 1645   piperacillin-tazobactam (ZOSYN)  IV 3.375 g (01/15/24 0542)   vancomycin (VANCOCIN) 1,250 mg in sodium chloride 0.9 % 250 mL IVPB 1,250 mg (01/15/24 0540)   PRN Meds:.acetaminophen, HYDROmorphone (DILAUDID) injection, ondansetron, oxyCODONE  Current Outpatient Medications  Medication Instructions   HYDROcodone-acetaminophen (NORCO/VICODIN) 5-325 MG tablet 1-2 tablets, Oral, Every 6 hours PRN    Diet Orders (From admission, onward)     Start     Ordered   01/14/24 1633  Diet regular Room service appropriate? Yes; Fluid consistency: Thin  Diet effective now       Question Answer Comment  Room service  appropriate? Yes   Fluid consistency: Thin      01/14/24 1632            DVT prophylaxis: SCDs Start: 01/14/24 1633   Lab Results  Component Value Date   PLT 303 01/15/2024      Code Status: Full Code  Family Communication: Family at bedside  Status is: Inpatient Remains inpatient appropriate because: IV antibiotics   Level of care: Med-Surg  Consultants:  Infectious disease Urology  Objective: Vitals:   01/14/24 1926 01/14/24 2114 01/15/24 0129 01/15/24 0626  BP: 121/61 126/77 117/66 116/66  Pulse: 91 93 87 73  Resp: 18 18 18 18   Temp: 97.9 F (36.6 C) 97.9 F (36.6 C) 97.7 F (36.5 C) 97.7 F (36.5 C)  TempSrc: Oral Oral Oral Oral  SpO2: 95% 95% 92% 97%  Weight:      Height:        Intake/Output Summary (Last 24 hours) at 01/15/2024 1124 Last data filed at 01/15/2024 0900 Gross per 24 hour  Intake 3396.27 ml  Output 1205 ml  Net 2191.27 ml   Wt Readings from Last 3 Encounters:  01/14/24 122.5 kg  01/13/24 122.9 kg  01/10/24 123 kg    Examination:  Constitutional: NAD Eyes: no scleral icterus ENMT: Mucous membranes are moist.  Neck: normal, supple Respiratory: clear to auscultation bilaterally, no wheezing, no crackles. Normal respiratory effort. No accessory muscle use.  Cardiovascular: Regular rate and rhythm, no murmurs / rubs / gallops. No LE edema.  Abdomen: non distended, no tenderness. Bowel sounds positive.  Musculoskeletal: no clubbing / cyanosis.  Data Reviewed: I have independently reviewed following labs and imaging studies   CBC Recent Labs  Lab 01/10/24 1255 01/13/24 1305 01/14/24 1051 01/15/24 0422  WBC 12.6* 10.2 12.7* 14.7*  HGB 14.8 15.1 13.8 13.0  HCT 47.9 47.2 43.0 41.3  PLT 295 300 312 303  MCV 92.8 91.1 89.8 90.8  MCH 28.7 29.2 28.8 28.6  MCHC 30.9 32.0 32.1 31.5  RDW 14.2 14.2 14.3 13.9  LYMPHSABS 2.7 1.4  --   --   MONOABS 1.3* 0.7  --   --   EOSABS 0.2 0.3  --   --   BASOSABS 0.1 0.1  --   --      Recent Labs  Lab 01/10/24 1255 01/13/24 1305 01/13/24 1316 01/14/24 1051 01/15/24 0422  NA 135 137  --  133* 135  K 4.3 4.7  --  4.3 4.0  CL 104 101  --  102 103  CO2 25 22  --  20* 23  GLUCOSE 90 133*  --  90 157*  BUN 8 7  --  11 12  CREATININE 1.12 1.31*  --  1.31* 1.15  CALCIUM 9.6 9.7  --  8.8* 9.1  LATICACIDVEN  --   --  0.8  --   --     ------------------------------------------------------------------------------------------------------------------ No results for input(s): "CHOL", "HDL", "LDLCALC", "TRIG", "CHOLHDL", "LDLDIRECT" in the last 72 hours.  Lab Results  Component Value Date   HGBA1C 5.1 02/03/2014   ------------------------------------------------------------------------------------------------------------------ No results for input(s): "TSH", "T4TOTAL", "T3FREE", "THYROIDAB" in the last 72 hours.  Invalid input(s): "FREET3"  Cardiac Enzymes No results for input(s): "CKMB", "TROPONINI", "MYOGLOBIN" in the last 168 hours.  Invalid input(s): "CK" ------------------------------------------------------------------------------------------------------------------ No results found for: "BNP"  CBG: No results for input(s): "GLUCAP" in the last 168 hours.  Recent Results (from the past 240 hours)  Aerobic/Anaerobic Culture w Gram Stain (surgical/deep wound)     Status: None (Preliminary result)   Collection Time: 01/14/24  1:57 PM   Specimen: Wound; Tissue  Result Value Ref Range Status   Specimen Description   Final    ABSCESS Performed at [facility], [address], [place], [place] [address]    Special Requests   Final    SCROTUM Performed at [facility], [address], [place], [place] [address]    Gram Stain   Final    ABUNDANT WBC PRESENT, PREDOMINANTLY PMN FEW GRAM POSITIVE COCCI RARE GRAM NEGATIVE RODS Performed at [facility], [address] [place], [place] [address]    Culture  PENDING  Incomplete   Report Status PENDING  Incomplete     Radiology Studies: No results found.   Pamella Pert, MD, PhD [facility]  Between 7 am - 7 pm I am available, please contact me via Amion (for emergencies) or Securechat (non urgent messages)  Between 7 pm - 7 am I am not available, please contact night coverage MD/APP via Amion

## 2024-01-15 NOTE — Progress Notes (Addendum)
 1 Day Post-Op Subjective: Pt resting comfortably in bed on arrival. He was accompanied by his wife. Reviewed case and plan with both of them, nursing, and wound care. NAEON. Pain if well managed except for wound care. Reprts he has not had bowel movement in over a week.   Objective: Vital signs in last 24 hours: Temp:  [97.6 F (36.4 C)-98.4 F (36.9 C)] 97.7 F (36.5 C) (02/28 0626) Pulse Rate:  [73-104] 73 (02/28 0626) Resp:  [7-20] 18 (02/28 0626) BP: (116-156)/(61-112) 116/66 (02/28 0626) SpO2:  [91 %-97 %] 97 % (02/28 0626)  Assessment/Plan: # AKI- resolved # Right scrotal abscess   To the OR with Dr. Pete Glatter for I&D 01/14/24. Cultures pending.   Preop ABX provided. ID will assess and offer med recs.  Corynebacterium and actinomyces species last time  Wife is familiar with wound care and I trained her last time.  Reviewed with wound care again today. He barely tolerated mild open packing with liberal pre-medication. This will, as last time, be the limiting factor when trying to get him home.   Nursing to complete wet-->dry dressing changes daily.  Trend labs. AKI resolved. Interval worsening of leukocytosis.   Follow up in clinic 2-3 weeks.   Will see intermittently. Please call with questions or concerns over weekend. Otherwise I'll see him during wound care on Monday.   Intake/Output from previous day: 02/27 0701 - 02/28 0700 In: 3156.3 [P.O.:1680; I.V.:1226.8; IV Piggyback:249.4] Out: 1205 [Urine:1200; Blood:5]  Intake/Output this shift: Total I/O In: 240 [P.O.:240] Out: -   Physical Exam:  General: Alert and oriented CV: No cyanosis Lungs: equal chest rise Gu: right posterior scrotal incision. Wound bed beefy red with superficial bleeding on removal of packing. No areas of necrosis, purulent or dishwater drainage.   Lab Results: Recent Labs    01/13/24 1305 01/14/24 1051 01/15/24 0422  HGB 15.1 13.8 13.0  HCT 47.2 43.0 41.3   BMET Recent Labs     01/14/24 1051 01/15/24 0422  NA 133* 135  K 4.3 4.0  CL 102 103  CO2 20* 23  GLUCOSE 90 157*  BUN 11 12  CREATININE 1.31* 1.15  CALCIUM 8.8* 9.1     Studies/Results: US SCROTUM W/DOPPLER Result Date: 01/13/2024 CLINICAL DATA:  Testicular abscess. EXAM: SCROTAL ULTRASOUND DOPPLER ULTRASOUND OF THE TESTICLES TECHNIQUE: Complete ultrasound examination of the testicles, epididymis, and other scrotal structures was performed. Color and spectral Doppler ultrasound were also utilized to evaluate blood flow to the testicles. COMPARISON:  Scrotal ultrasound 01/10/2024.  CT pelvis 12/20/2023 FINDINGS: Right testicle Measurements: 5 x 2.9 x 3 cm. Testicular parenchyma appears homogeneous. Color flow Doppler signal is homogeneous and normal. No mass or testicular collection identified. There is diffuse scrotal skin thickening. Corresponding to the area of pain and swelling lateral to the right scrotum, there is a heterogeneous collection measuring 5.8 by 1.7 x 2.2 cm. This contains fluid and heterogeneous echotexture material. Peripheral flow on color flow Doppler imaging without central flow. This likely corresponds to the abscess seen in the soft tissues of the right hemiscrotum on prior CT. Left testicle Measurements: 4.4 x 2.8 x 3.1 cm. Normal parenchymal echotexture. No mass or collection is identified. Homogeneous color flow Doppler signal. Right epididymis:  Normal in size and appearance. Left epididymis:  Normal in size and appearance. Hydrocele:  Bilateral scrotal hydroceles. Varicocele:  Small bilateral varicoceles. Pulsed Doppler interrogation of both testes demonstrates normal low resistance arterial and venous waveforms bilaterally. IMPRESSION: 1. No evidence  of testicular mass or torsion. 2. Diffuse scrotal skin thickening. 3. Heterogeneous collection lateral to the right scrotum corresponding to area of pain. This measures 5.8 cm maximal diameter and likely corresponds to abscess seen on prior  CT. Electronically Signed   By: Burman Nieves M.D.   On: 01/13/2024 16:25      LOS: 1 day   Elmon Kirschner, NP [facility] Pager: [phone]  01/15/2024, 10:47 AM

## 2024-01-15 NOTE — Consult Note (Addendum)
 WOC Nurse Consult Note: Reason for Consult: Consult requested for possible Vac to scrotum wound.  Dressing change performed with Urology PA at the bedside to assess wound appearance and discuss plan of care.  Right upper inner scrotum with full thickness post-op wound, beefy red with mod amt bloody drainage.  6X3X4cm with tunneling at 12:00 o'clock to 4 cm.  Pt was medicated for pain with IV Diluadid prior to the procedure but was still in extreme pain. Applied moist gaize packing using swab to fill and demonstrated procedure to girlfriend at the bedside, wh states she will perform the procedure after discharge.  Discussed importance of packing up into the tunneling area and filling wound bed, despite pain.  Applied ABD pad and mesh underwear to hold in place to avoid use of tape.  PA at the bedside discussed that IV pain meds are not available after discharge for pain.  I do not think a Vac wound be able to maintain a seal, since the wound is located in an inner leg crease and also the area is moist all the time and drape could not maintain a seal.   Pt could benefit from home health assistance after discharge for dressing changes. Dressing procedure/placement/frequency: Topical treatment orders provided for bedside nurses to perform as follows: Pack right scrotum wound Q day with moist kerlex, using swab to fill tunneling area at the top and inner edges, then cover with ABD pad and mesh underwear. Avoid use of tape. Moisten previous dressing with NS to assist with removal.  Please re-consult if further assistance is needed.  Thank-you,  Cammie Mcgee MSN, RN, CWOCN, [place], CNS [phone]

## 2024-01-15 NOTE — Plan of Care (Signed)
  Problem: Education: Goal: Knowledge of General Education information will improve Description: Including pain rating scale, medication(s)/side effects and non-pharmacologic comfort measures Outcome: Progressing   Problem: Health Behavior/Discharge Planning: Goal: Ability to manage health-related needs will improve Outcome: Progressing   Problem: Clinical Measurements: Goal: Ability to maintain clinical measurements within normal limits will improve Outcome: Progressing Goal: Will remain free from infection Outcome: Progressing Goal: Diagnostic test results will improve Outcome: Progressing Goal: Respiratory complications will improve Outcome: Progressing Goal: Cardiovascular complication will be avoided Outcome: Progressing   Problem: Activity: Goal: Risk for activity intolerance will decrease Outcome: Progressing   Problem: Nutrition: Goal: Adequate nutrition will be maintained Outcome: Progressing   Problem: Coping: Goal: Level of anxiety will decrease Outcome: Progressing   Problem: Elimination: Goal: Will not experience complications related to bowel motility Outcome: Progressing Goal: Will not experience complications related to urinary retention Outcome: Progressing   Problem: Pain Managment: Goal: General experience of comfort will improve and/or be controlled Outcome: Progressing   Problem: Safety: Goal: Ability to remain free from injury will improve Outcome: Progressing   Problem: Skin Integrity: Goal: Risk for impaired skin integrity will decrease Outcome: Progressing   Problem: Clinical Measurements: Goal: Ability to avoid or minimize complications of infection will improve Outcome: Progressing   Problem: Skin Integrity: Goal: Skin integrity will improve Outcome: Progressing   Problem: Education: Goal: Knowledge of General Education information will improve Description: Including pain rating scale, medication(s)/side effects and  non-pharmacologic comfort measures Outcome: Progressing   Problem: Health Behavior/Discharge Planning: Goal: Ability to manage health-related needs will improve Outcome: Progressing   Problem: Clinical Measurements: Goal: Ability to maintain clinical measurements within normal limits will improve Outcome: Progressing Goal: Will remain free from infection Outcome: Progressing Goal: Diagnostic test results will improve Outcome: Progressing Goal: Respiratory complications will improve Outcome: Progressing Goal: Cardiovascular complication will be avoided Outcome: Progressing   Problem: Activity: Goal: Risk for activity intolerance will decrease Outcome: Progressing   Problem: Nutrition: Goal: Adequate nutrition will be maintained Outcome: Progressing   Problem: Coping: Goal: Level of anxiety will decrease Outcome: Progressing   Problem: Elimination: Goal: Will not experience complications related to bowel motility Outcome: Progressing Goal: Will not experience complications related to urinary retention Outcome: Progressing   Problem: Pain Managment: Goal: General experience of comfort will improve and/or be controlled Outcome: Progressing   Problem: Safety: Goal: Ability to remain free from injury will improve Outcome: Progressing   Problem: Skin Integrity: Goal: Risk for impaired skin integrity will decrease Outcome: Progressing

## 2024-01-15 NOTE — Progress Notes (Signed)
 New urinal at bedside. Pt will call when specimen available.

## 2024-01-16 LAB — COMPREHENSIVE METABOLIC PANEL
ALT: 21 U/L (ref 0–44)
AST: 17 U/L (ref 15–41)
Albumin: 3 g/dL — ABNORMAL LOW (ref 3.5–5.0)
Alkaline Phosphatase: 44 U/L (ref 38–126)
Anion gap: 5 (ref 5–15)
BUN: 12 mg/dL (ref 6–20)
CO2: 27 mmol/L (ref 22–32)
Calcium: 8.4 mg/dL — ABNORMAL LOW (ref 8.9–10.3)
Chloride: 105 mmol/L (ref 98–111)
Creatinine, Ser: 1.35 mg/dL — ABNORMAL HIGH (ref 0.61–1.24)
GFR, Estimated: >60 mL/min (ref >60)
Glucose, Bld: 101 mg/dL — ABNORMAL HIGH (ref 70–99)
Potassium: 3.8 mmol/L (ref 3.5–5.1)
Sodium: 137 mmol/L (ref 135–145)
Total Bilirubin: 0.7 mg/dL (ref 0.0–1.2)
Total Protein: 7.3 g/dL (ref 6.5–8.1)

## 2024-01-16 LAB — CBC
HCT: 40.3 % (ref 39.0–52.0)
Hemoglobin: 12.7 g/dL — ABNORMAL LOW (ref 13.0–17.0)
MCH: 28.8 pg (ref 26.0–34.0)
MCHC: 31.5 g/dL (ref 30.0–36.0)
MCV: 91.4 fL (ref 80.0–100.0)
Platelets: 317 10*3/uL (ref 150–400)
RBC: 4.41 MIL/uL (ref 4.22–5.81)
RDW: 13.8 % (ref 11.5–15.5)
WBC: 9.9 10*3/uL (ref 4.0–10.5)
nRBC: 0 % (ref 0.0–0.2)

## 2024-01-16 LAB — MAGNESIUM: Magnesium: 2.3 mg/dL (ref 1.7–2.4)

## 2024-01-16 MED ORDER — VANCOMYCIN HCL 1250 MG/250ML IV SOLN
1250.0000 mg | Freq: Two times a day (BID) | INTRAVENOUS | Status: DC
  Administered 2024-01-16: 1250 mg via INTRAVENOUS
  Filled 2024-01-16 (×2): qty 250

## 2024-01-16 NOTE — Progress Notes (Addendum)
                                                  Against Medical Advice Patient at this time expresses desire to leave the Hospital immediately, patient has been warned that this is not Medically advisable at this time, and can result in Medical complications like Death and Disability, patient understands and accepts the risks involved and assumes full responsibilty of this decision.  This patient has also been advised that if they feel the need for further medical assistance to return to any available ER or dial [phone].  Informed by Nursing staff that this patient has left care and has signed the form  Against Medical Advice on 01/16/2024 at 2003  Hrs.  Chinita Greenland BSN MSNA MSN ACNPC-AG Acute Care Nurse Practitioner [facility]

## 2024-01-16 NOTE — Progress Notes (Signed)
 Educated wife on her husband's dressing change this morning since she will be doing wound care at home. Verbalizes understanding. Patient wanted to take a shower first before dressing change to right scrotum. Will be premedicated with oxy which is prn every 4 hours as needed prior to dressing change.   7829- Tolerated dressing change fair. Wife says she is confident in the dressing changes since she has been doing them and she has watched the nurses do it.

## 2024-01-16 NOTE — Plan of Care (Signed)

## 2024-01-16 NOTE — Progress Notes (Signed)
 Called into patient's room at 0744, patient desiring to leave Against Medical Advice. Assessment not able to complete since patient was preparing to leave. On call HCP made aware.   Patient signed AMA paper on 01/16/2024 on 2003.

## 2024-01-16 NOTE — Plan of Care (Signed)
  Problem: Education: Goal: Knowledge of General Education information will improve Description: Including pain rating scale, medication(s)/side effects and non-pharmacologic comfort measures Outcome: Progressing   Problem: Health Behavior/Discharge Planning: Goal: Ability to manage health-related needs will improve Outcome: Progressing   Problem: Clinical Measurements: Goal: Ability to maintain clinical measurements within normal limits will improve Outcome: Progressing Goal: Will remain free from infection Outcome: Progressing Goal: Diagnostic test results will improve Outcome: Progressing Goal: Respiratory complications will improve Outcome: Progressing Goal: Cardiovascular complication will be avoided Outcome: Progressing   Problem: Activity: Goal: Risk for activity intolerance will decrease Outcome: Progressing   Problem: Nutrition: Goal: Adequate nutrition will be maintained Outcome: Progressing   Problem: Coping: Goal: Level of anxiety will decrease Outcome: Progressing   Problem: Elimination: Goal: Will not experience complications related to bowel motility Outcome: Progressing Goal: Will not experience complications related to urinary retention Outcome: Progressing   Problem: Pain Managment: Goal: General experience of comfort will improve and/or be controlled Outcome: Progressing   Problem: Safety: Goal: Ability to remain free from injury will improve Outcome: Progressing   Problem: Skin Integrity: Goal: Risk for impaired skin integrity will decrease Outcome: Progressing   Problem: Clinical Measurements: Goal: Ability to avoid or minimize complications of infection will improve Outcome: Progressing   Problem: Skin Integrity: Goal: Skin integrity will improve Outcome: Progressing   Problem: Education: Goal: Knowledge of General Education information will improve Description: Including pain rating scale, medication(s)/side effects and  non-pharmacologic comfort measures Outcome: Progressing   Problem: Health Behavior/Discharge Planning: Goal: Ability to manage health-related needs will improve Outcome: Progressing   Problem: Clinical Measurements: Goal: Ability to maintain clinical measurements within normal limits will improve Outcome: Progressing Goal: Will remain free from infection Outcome: Progressing Goal: Diagnostic test results will improve Outcome: Progressing Goal: Respiratory complications will improve Outcome: Progressing Goal: Cardiovascular complication will be avoided Outcome: Progressing   Problem: Activity: Goal: Risk for activity intolerance will decrease Outcome: Progressing   Problem: Nutrition: Goal: Adequate nutrition will be maintained Outcome: Progressing   Problem: Coping: Goal: Level of anxiety will decrease Outcome: Progressing   Problem: Elimination: Goal: Will not experience complications related to bowel motility Outcome: Progressing Goal: Will not experience complications related to urinary retention Outcome: Progressing   Problem: Pain Managment: Goal: General experience of comfort will improve and/or be controlled Outcome: Progressing   Problem: Safety: Goal: Ability to remain free from injury will improve Outcome: Progressing   Problem: Skin Integrity: Goal: Risk for impaired skin integrity will decrease Outcome: Progressing

## 2024-01-16 NOTE — Progress Notes (Signed)
 PROGRESS NOTE  Ryan Lester ZOX:[id number] DOB: 1996/05/15 DOA: 01/14/2024 PCP: Pcp, No   LOS: 2 days   Brief Narrative / Interim history: 28 years old male with past medical history of scrotal abscess recently admitted this month on February 2nd  when he had I&D of abscess presented to the hospital with evaluation of recurrent abscess.  This time patient noticed increasing swelling, redness around the scrotal area for few days.  Has a history of the same few weeks ago, underwent I&D, cultures grew Corynebacterium as well as actinomyces.  He was sent home on Augmentin, but per chart review could not tolerate dressing changes at home, also return to work and infection recurred.  Subjective / 24h Interval events: Very insistent he goes home today. Wants to smoke  Assesement and Plan: Principal problem Right scrotal abscess -recurrent, status post I&D by urology 2/27.  Continue to monitor cultures, ID consulted.  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, he would benefit from weight loss  Tobacco use-recommend cessation  Scheduled Meds:  docusate sodium  100 mg Oral BID   nicotine  21 mg Transdermal Daily   Continuous Infusions:  piperacillin-tazobactam (ZOSYN)  IV 3.375 g (01/16/24 1322)   vancomycin (VANCOCIN) 1,250 mg in sodium chloride 0.9 % 250 mL IVPB 1,250 mg (01/16/24 0524)   PRN Meds:.acetaminophen, HYDROmorphone (DILAUDID) injection, ondansetron, oxyCODONE  Current Outpatient Medications  Medication Instructions   HYDROcodone-acetaminophen (NORCO/VICODIN) 5-325 MG tablet 1-2 tablets, Oral, Every 6 hours PRN    Diet Orders (From admission, onward)     Start     Ordered   01/14/24 1633  Diet regular Room service appropriate? Yes; Fluid consistency: Thin  Diet effective now       Question Answer Comment   Room service appropriate? Yes   Fluid consistency: Thin      01/14/24 1632            DVT prophylaxis: SCDs Start: 01/14/24 1633   Lab Results  Component Value Date   PLT 317 01/16/2024      Code Status: Full Code  Family Communication: Family at bedside  Status is: Inpatient Remains inpatient appropriate because: IV antibiotics   Level of care: Med-Surg  Consultants:  Infectious disease Urology  Objective: Vitals:   01/15/24 1255 01/15/24 2018 01/16/24 0540 01/16/24 1316  BP: 137/70 (!) 149/83 (!) 140/98 (!) 149/92  Pulse: 75 82 63 88  Resp: 18 18 16 20   Temp: 98 F (36.7 C) 98.6 F (37 C) 97.8 F (36.6 C) 98 F (36.7 C)  TempSrc: Oral Oral Oral Oral  SpO2: 99% 96% 98% 97%  Weight:      Height:        Intake/Output Summary (Last 24 hours) at 01/16/2024 1406 Last data filed at 01/16/2024 0900 Gross per 24 hour  Intake 1310.69 ml  Output 0 ml  Net 1310.69 ml   Wt Readings from Last 3 Encounters:  01/14/24 122.5 kg  01/13/24 122.9 kg  01/10/24 123 kg    Examination:  Constitutional: NAD Eyes: lids and conjunctivae normal, no scleral icterus ENMT: mmm Neck: normal, supple Respiratory:  no wheezing Cardiovascular: No LE edema. Abdomen: no distention  Data Reviewed: I have independently reviewed following labs and imaging studies   CBC Recent Labs  Lab 01/10/24 1255 01/13/24 1305 01/14/24 1051 01/15/24 0422 01/16/24 8119  WBC 12.6* 10.2 12.7* 14.7* 9.9  HGB 14.8 15.1 13.8 13.0 12.7*  HCT 47.9 47.2 43.0 41.3 40.3  PLT 295 300 312 303 317  MCV 92.8 91.1 89.8 90.8 91.4  MCH 28.7 29.2 28.8 28.6 28.8  MCHC 30.9 32.0 32.1 31.5 31.5  RDW 14.2 14.2 14.3 13.9 13.8  LYMPHSABS 2.7 1.4  --   --   --   MONOABS 1.3* 0.7  --   --   --   EOSABS 0.2 0.3  --   --   --   BASOSABS 0.1 0.1  --   --   --     Recent Labs  Lab 01/10/24 1255 01/13/24 1305 01/13/24 1316 01/14/24 1051 01/15/24 0422 01/16/24 0519  NA 135 137  --  133* 135 137  K  4.3 4.7  --  4.3 4.0 3.8  CL 104 101  --  102 103 105  CO2 25 22  --  20* 23 27  GLUCOSE 90 133*  --  90 157* 101*  BUN 8 7  --  11 12 12   CREATININE 1.12 1.31*  --  1.31* 1.15 1.35*  CALCIUM 9.6 9.7  --  8.8* 9.1 8.4*  AST  --   --   --   --   --  17  ALT  --   --   --   --   --  21  ALKPHOS  --   --   --   --   --  44  BILITOT  --   --   --   --   --  0.7  ALBUMIN  --   --   --   --   --  3.0*  MG  --   --   --   --   --  2.3  LATICACIDVEN  --   --  0.8  --   --   --     ------------------------------------------------------------------------------------------------------------------ No results for input(s): "CHOL", "HDL", "LDLCALC", "TRIG", "CHOLHDL", "LDLDIRECT" in the last 72 hours.  Lab Results  Component Value Date   HGBA1C 5.1 02/03/2014   ------------------------------------------------------------------------------------------------------------------ No results for input(s): "TSH", "T4TOTAL", "T3FREE", "THYROIDAB" in the last 72 hours.  Invalid input(s): "FREET3"  Cardiac Enzymes No results for input(s): "CKMB", "TROPONINI", "MYOGLOBIN" in the last 168 hours.  Invalid input(s): "CK" ------------------------------------------------------------------------------------------------------------------ No results found for: "BNP"  CBG: No results for input(s): "GLUCAP" in the last 168 hours.  Recent Results (from the past 240 hours)  Aerobic/Anaerobic Culture w Lester Stain (surgical/deep wound)     Status: None (Preliminary result)   Collection Time: 01/14/24  1:57 PM   Specimen: Wound; Tissue  Result Value Ref Range Status   Specimen Description   Final    ABSCESS Performed at [facility], [address], [place], [place] [address]    Special Requests   Final    SCROTUM Performed at [facility], [address], [place], [place] [address]    Lester Stain   Final    ABUNDANT WBC PRESENT, PREDOMINANTLY PMN FEW Lester POSITIVE  COCCI RARE Lester NEGATIVE RODS    Culture   Final    NO GROWTH 2 DAYS Performed at [facility], [address] [place], [place] [address]    Report Status PENDING  Incomplete     Radiology Studies: No results found.   Pamella Pert, MD, PhD [facility]  Between 7 am - 7 pm I am available,  please contact me via Amion (for emergencies) or Securechat (non urgent messages)  Between 7 pm - 7 am I am not available, please contact night coverage MD/APP via Amion

## 2024-01-17 NOTE — Discharge Summary (Signed)
 Physician AGAINST MEDICAL ADVICE discharge Summary  BASEL DEFALCO WUJ:[id number] DOB: [birth year] DOA: 01/14/2024  PCP: Pcp, No  Admit date: 01/14/2024 Discharge date: 01/17/2024  Admitted From: home Disposition:  home  Recommendations for Outpatient Follow-up:  Follow up with PCP ASAP  Brief Narrative / Interim history: 28 years old male with past medical history of scrotal abscess recently admitted this month on February 2nd  when he had I&D of abscess presented to the hospital with evaluation of recurrent abscess.  This time patient noticed increasing swelling, redness around the scrotal area for few days.  Has a history of the same few weeks ago, underwent I&D, cultures grew Corynebacterium as well as actinomyces.  He was sent home on Augmentin, but per chart review could not tolerate dressing changes at home, also return to work and infection recurred.  Hospital Course / Discharge diagnoses: Patient was admitted to the hospital with relapsing right scrotal abscess, was hospitalized few weeks ago, status post I&D, however after treatment, his infection recurred.  He was taken again to the OR by urology status post I&D on 2/27.  Due to previous failure of treatment, ID was consulted.  Recommendations were made for the patient to wait in the hospital while maintained on IV antibiotics until cultures are finalized, however, it appears that after hours, patient decided to leave the hospital AMA.  He did express to me earlier that he wants to smoke and has difficulties staying here for that reason   Discharge Instructions   Allergies as of 01/16/2024       Reactions   Sulfa Antibiotics Itching, Rash        Medication List     ASK your doctor about these medications    HYDROcodone-acetaminophen 5-325 MG tablet Commonly known as: NORCO/VICODIN Take 1-2 tablets by mouth every 6 (six) hours as needed for moderate pain (pain score 4-6) or severe pain (pain score 7-10).          Consultations: Urology ID  Procedures/Studies:  US SCROTUM W/DOPPLER Result Date: 01/13/2024 CLINICAL DATA:  Testicular abscess. EXAM: SCROTAL ULTRASOUND DOPPLER ULTRASOUND OF THE TESTICLES TECHNIQUE: Complete ultrasound examination of the testicles, epididymis, and other scrotal structures was performed. Color and spectral Doppler ultrasound were also utilized to evaluate blood flow to the testicles. COMPARISON:  Scrotal ultrasound 01/10/2024.  CT pelvis 12/20/2023 FINDINGS: Right testicle Measurements: 5 x 2.9 x 3 cm. Testicular parenchyma appears homogeneous. Color flow Doppler signal is homogeneous and normal. No mass or testicular collection identified. There is diffuse scrotal skin thickening. Corresponding to the area of pain and swelling lateral to the right scrotum, there is a heterogeneous collection measuring 5.8 by 1.7 x 2.2 cm. This contains fluid and heterogeneous echotexture material. Peripheral flow on color flow Doppler imaging without central flow. This likely corresponds to the abscess seen in the soft tissues of the right hemiscrotum on prior CT. Left testicle Measurements: 4.4 x 2.8 x 3.1 cm. Normal parenchymal echotexture. No mass or collection is identified. Homogeneous color flow Doppler signal. Right epididymis:  Normal in size and appearance. Left epididymis:  Normal in size and appearance. Hydrocele:  Bilateral scrotal hydroceles. Varicocele:  Small bilateral varicoceles. Pulsed Doppler interrogation of both testes demonstrates normal low resistance arterial and venous waveforms bilaterally. IMPRESSION: 1. No evidence of testicular mass or torsion. 2. Diffuse scrotal skin thickening. 3. Heterogeneous collection lateral to the right scrotum corresponding to area of pain. This measures 5.8 cm maximal diameter and likely corresponds to abscess seen on  prior CT. Electronically Signed   By: Burman Nieves M.D.   On: 01/13/2024 16:25   US SCROTUM W/DOPPLER Result Date:  01/10/2024 CLINICAL DATA:  Two day history of pain and swelling EXAM: SCROTAL ULTRASOUND DOPPLER ULTRASOUND OF THE TESTICLES TECHNIQUE: Complete ultrasound examination of the testicles, epididymis, and other scrotal structures was performed. Color and spectral Doppler ultrasound were also utilized to evaluate blood flow to the testicles. COMPARISON:  CT pelvis and scrotal ultrasound examination dated 12/20/2023 FINDINGS: Right testicle Measurements: 5.3 x 3.2 x 2.5 cm, 30.1 mL. No mass or microlithiasis visualized. Left testicle Measurements: 4.5 x 3.3 x 2.8 cm, 29.5 mL. No mass or microlithiasis visualized. Right epididymis: Normal in size and appearance. Asymmetric echogenicity and thickening of the right spermatic cord. Left epididymis:  Normal in size and appearance. Hydrocele:  None visualized. Varicocele:  None visualized. Pulsed Doppler interrogation of both testes demonstrates normal low resistance arterial and venous waveforms bilaterally. IMPRESSION: 1. No evidence of testicular torsion. 2. Asymmetric echogenicity and thickening of the right spermatic cord, which may represent reactive or infectious/inflammatory vasitis. No focal fluid collection. Electronically Signed   By: Agustin Cree M.D.   On: 01/10/2024 14:04   CT PELVIS W CONTRAST Result Date: 12/20/2023 CLINICAL DATA:  Scrotal mass or lump, concern with testicular abscess. EXAM: CT PELVIS WITH CONTRAST TECHNIQUE: Multidetector CT imaging of the pelvis was performed using the standard protocol following the bolus administration of intravenous contrast. RADIATION DOSE REDUCTION: This exam was performed according to the departmental dose-optimization program which includes automated exposure control, adjustment of the mA and/or kV according to patient size and/or use of iterative reconstruction technique. CONTRAST:  75mL OMNIPAQUE IOHEXOL 350 MG/ML SOLN COMPARISON:  Scrotal ultrasound 12/20/2023 FINDINGS: Urinary Tract:  No abnormality visualized.  Bowel:  Unremarkable visualized pelvic bowel loops. Vascular/Lymphatic: No pathologically enlarged lymph nodes. No significant vascular abnormality seen. Reproductive: Prostate is unremarkable. Small bilateral hydroceles. Within the right scrotum there is a peripherally enhancing fluid collection or mass measuring 3.0 x 2.0 cm (series 5/image 158). Adjacent edema. Mild adjacent stranding. No soft tissue gas. Other: None. Musculoskeletal: No acute fracture. IMPRESSION: Findings suggest abscess in the right scrotum. Clinical or sonographic follow-up to resolution is recommended to exclude underlying mass. Electronically Signed   By: Minerva Fester M.D.   On: 12/20/2023 19:52   US SCROTUM W/DOPPLER Result Date: 12/20/2023 CLINICAL DATA:  Testicular pain. EXAM: SCROTAL ULTRASOUND DOPPLER ULTRASOUND OF THE TESTICLES TECHNIQUE: Complete ultrasound examination of the testicles, epididymis, and other scrotal structures was performed. Color and spectral Doppler ultrasound were also utilized to evaluate blood flow to the testicles. COMPARISON:  February 07, 2023. FINDINGS: Right testicle Measurements: 5.6 x 3.4 x 2.3 cm. No mass or microlithiasis visualized. 3.6 x 3.7 x 2.8 cm complex structure is seen inferior to the right testicle concerning for possible abscess or less likely neoplasm. Left testicle Measurements: 5.1 x 3.5 x 2.2 cm. No mass or microlithiasis visualized. Right epididymis:  Normal in size and appearance. Left epididymis:  Normal in size and appearance. Hydrocele:  Small right hydrocele is noted. Varicocele:  None. Pulsed Doppler interrogation of both testes demonstrates normal low resistance arterial and venous waveforms bilaterally. IMPRESSION: No evidence of testicular torsion. 3.6 x 3.7 x 2.8 cm complex abnormality is seen inferior to the right testicle concerning for possible abscess or less likely neoplasm. Consultation with urology is recommended. Small right hydrocele. Electronically Signed   By:  Lupita Raider M.D.   On: 12/20/2023 17:46  The results of significant diagnostics from this hospitalization (including imaging, microbiology, ancillary and laboratory) are listed below for reference.     Microbiology: Recent Results (from the past 240 hours)  Aerobic/Anaerobic Culture w Gram Stain (surgical/deep wound)     Status: None (Preliminary result)   Collection Time: 01/14/24  1:57 PM   Specimen: Wound; Tissue  Result Value Ref Range Status   Specimen Description   Final    ABSCESS Performed at [facility], [address] [place], [place] [address]    Special Requests   Final    SCROTUM Performed at [facility], [address], [place], [place] [address]    Gram Stain   Final    ABUNDANT WBC PRESENT, PREDOMINANTLY PMN FEW GRAM POSITIVE COCCI RARE GRAM NEGATIVE RODS    Culture   Final    HOLDING FOR POSSIBLE ANAEROBE Performed at [facility], [address], [place], [place] [address]    Report Status PENDING  Incomplete     Labs: Basic Metabolic Panel: Recent Labs  Lab 01/10/24 1255 01/13/24 1305 01/14/24 1051 01/15/24 0422 01/16/24 0519  NA 135 137 133* 135 137  K 4.3 4.7 4.3 4.0 3.8  CL 104 101 102 103 105  CO2 25 22 20* 23 27  GLUCOSE 90 133* 90 157* 101*  BUN 8 7 11 12 12   CREATININE 1.12 1.31* 1.31* 1.15 1.35*  CALCIUM 9.6 9.7 8.8* 9.1 8.4*  MG  --   --   --   --  2.3   Liver Function Tests: Recent Labs  Lab 01/16/24 0519  AST 17  ALT 21  ALKPHOS 44  BILITOT 0.7  PROT 7.3  ALBUMIN 3.0*   CBC: Recent Labs  Lab 01/10/24 1255 01/13/24 1305 01/14/24 1051 01/15/24 0422 01/16/24 0519  WBC 12.6* 10.2 12.7* 14.7* 9.9  NEUTROABS 8.4* 7.7  --   --   --   HGB 14.8 15.1 13.8 13.0 12.7*  HCT 47.9 47.2 43.0 41.3 40.3  MCV 92.8 91.1 89.8 90.8 91.4  PLT 295 300 312 303 317   CBG: No results for input(s): "GLUCAP" in the last 168 hours. Hgb A1c No results for input(s): "HGBA1C" in the  last 72 hours. Lipid Profile No results for input(s): "CHOL", "HDL", "LDLCALC", "TRIG", "CHOLHDL", "LDLDIRECT" in the last 72 hours. Thyroid function studies No results for input(s): "TSH", "T4TOTAL", "T3FREE", "THYROIDAB" in the last 72 hours.  Invalid input(s): "FREET3" Urinalysis    Component Value Date/Time   COLORURINE AMBER (A) 12/20/2023 1910   APPEARANCEUR CLEAR 12/20/2023 1910   LABSPEC 1.027 12/20/2023 1910   PHURINE 7.0 12/20/2023 1910   GLUCOSEU NEGATIVE 12/20/2023 1910   HGBUR SMALL (A) 12/20/2023 1910   BILIRUBINUR NEGATIVE 12/20/2023 1910   KETONESUR NEGATIVE 12/20/2023 1910   PROTEINUR 30 (A) 12/20/2023 1910   UROBILINOGEN 1.0 01/28/2013 1734   NITRITE NEGATIVE 12/20/2023 1910   LEUKOCYTESUR NEGATIVE 12/20/2023 1910    FURTHER DISCHARGE INSTRUCTIONS:   Get Medicines reviewed and adjusted: Please take all your medications with you for your next visit with your Primary MD   Laboratory/radiological data: Please request your Primary MD to go over all hospital tests and procedure/radiological results at the follow up, please ask your Primary MD to get all Hospital records sent to his/her office.   In some cases, they will be blood work, cultures and biopsy results pending at the time of your discharge. Please request that your primary care M.D. goes through  all the records of your hospital data and follows up on these results.   Also Note the following: If you experience worsening of your admission symptoms, develop shortness of breath, life threatening emergency, suicidal or homicidal thoughts you must seek medical attention immediately by calling 911 or calling your MD immediately  if symptoms less severe.   You must read complete instructions/literature along with all the possible adverse reactions/side effects for all the Medicines you take and that have been prescribed to you. Take any new Medicines after you have completely understood and accpet all the possible  adverse reactions/side effects.    Do not drive when taking Pain medications or sleeping medications (Benzodaizepines)   Do not take more than prescribed Pain, Sleep and Anxiety Medications. It is not advisable to combine anxiety,sleep and pain medications without talking with your primary care practitioner   Special Instructions: If you have smoked or chewed Tobacco  in the last 2 yrs please stop smoking, stop any regular Alcohol  and or any Recreational drug use.   Wear Seat belts while driving.   Please note: You were cared for by a hospitalist during your hospital stay. Once you are discharged, your primary care physician will handle any further medical issues. Please note that NO REFILLS for any discharge medications will be authorized once you are discharged, as it is imperative that you return to your primary care physician (or establish a relationship with a primary care physician if you do not have one) for your post hospital discharge needs so that they can reassess your need for medications and monitor your lab values.  Time coordinating discharge: 10 minutes  SIGNED:  Pamella Pert, MD, PhD 01/17/2024, 6:44 AM

## 2024-01-18 ENCOUNTER — Encounter: Payer: Self-pay | Admitting: *Deleted

## 2024-01-18 ENCOUNTER — Ambulatory Visit
Admission: EM | Admit: 2024-01-18 | Discharge: 2024-01-18 | Disposition: A | Discharge status: Home / Self Care | Payer: Self-pay

## 2024-01-18 ENCOUNTER — Other Ambulatory Visit: Payer: Self-pay

## 2024-01-18 DIAGNOSIS — T8149XA Infection following a procedure, other surgical site, initial encounter: Secondary | ICD-10-CM

## 2024-01-18 DIAGNOSIS — R5082 Postprocedural fever: Secondary | ICD-10-CM

## 2024-01-18 LAB — GC/CHLAMYDIA PROBE AMP (~~LOC~~) NOT AT ARMC
Chlamydia: NEGATIVE
Comment: NEGATIVE
Comment: NORMAL
Neisseria Gonorrhea: NEGATIVE

## 2024-01-18 NOTE — ED Notes (Signed)
 Patient is being discharged from the Urgent Care and sent to the Emergency Department via POV . Per Jerrilyn Cairo, NP, patient is in need of higher level of care due to Evaluation for sepsis. Patient is aware and verbalizes understanding of plan of care.  Vitals:   01/18/24 1204  BP: 136/80  Pulse: (!) 116  Resp: 16  Temp: 100.3 F (37.9 C)  SpO2: 94%

## 2024-01-18 NOTE — ED Provider Notes (Signed)
 Patient here for evaluation of concern for an infected wound.  Patient had a surgical I&D at the OR it was a long and was encouraged to remain hospitalized to receive IV antibiotics while awaiting cultures to ensure that patient's infection was resolving.  Patient was recently admitted due to sepsis earlier in February and readmitted for repeat I&D procedure on 01/14/2024 and patient left AMA prior to receiving adequate antibiotic treatment.  Patient is tachycardic, febrile and reports abnormal drainage from surgical wound on yesterday.  Patient advised to follow-up for emergent evaluation in the setting of [facility] Emergency Department for ongoing evaluation and treatment of postsurgical infection to rule out sepsis.   Bing Neighbors, NP 01/18/24 1259

## 2024-01-18 NOTE — ED Notes (Signed)
 01/18/24 1419 pt requesting discharge prescription from 3 rd floor. Dawn, [facility] aware and will speak to pt.

## 2024-01-18 NOTE — ED Triage Notes (Signed)
 Pt reports he had a abscess drained in the OR on 2/27. States he had to leave due to wait and didn't get prescribed antibiotics. He changed his dressing last night. He also now has a cough.

## 2024-01-19 ENCOUNTER — Other Ambulatory Visit: Payer: Self-pay

## 2024-01-19 ENCOUNTER — Encounter (HOSPITAL_COMMUNITY): Payer: Self-pay

## 2024-01-19 ENCOUNTER — Emergency Department (HOSPITAL_COMMUNITY)
Admission: EM | Admit: 2024-01-19 | Discharge: 2024-01-19 | Disposition: A | Discharge status: Home / Self Care | Payer: Self-pay | Attending: Emergency Medicine | Admitting: Emergency Medicine

## 2024-01-19 DIAGNOSIS — J101 Influenza due to other identified influenza virus with other respiratory manifestations: Secondary | ICD-10-CM | POA: Insufficient documentation

## 2024-01-19 DIAGNOSIS — N492 Inflammatory disorders of scrotum: Secondary | ICD-10-CM | POA: Insufficient documentation

## 2024-01-19 LAB — COMPREHENSIVE METABOLIC PANEL
ALT: 36 U/L (ref 0–44)
AST: 32 U/L (ref 15–41)
Albumin: 3.9 g/dL (ref 3.5–5.0)
Alkaline Phosphatase: 49 U/L (ref 38–126)
Anion gap: 8 (ref 5–15)
BUN: 11 mg/dL (ref 6–20)
CO2: 22 mmol/L (ref 22–32)
Calcium: 9.4 mg/dL (ref 8.9–10.3)
Chloride: 106 mmol/L (ref 98–111)
Creatinine, Ser: 1.04 mg/dL (ref 0.61–1.24)
GFR, Estimated: >60 mL/min (ref >60)
Glucose, Bld: 91 mg/dL (ref 70–99)
Potassium: 3.9 mmol/L (ref 3.5–5.1)
Sodium: 136 mmol/L (ref 135–145)
Total Bilirubin: 1 mg/dL (ref 0.0–1.2)
Total Protein: 8.7 g/dL — ABNORMAL HIGH (ref 6.5–8.1)

## 2024-01-19 LAB — LACTIC ACID, PLASMA: Lactic Acid, Venous: 0.8 mmol/L (ref 0.5–1.9)

## 2024-01-19 LAB — CBC WITH DIFFERENTIAL/PLATELET
Abs Immature Granulocytes: 0.1 10*3/uL — ABNORMAL HIGH (ref 0.00–0.07)
Basophils Absolute: 0.1 10*3/uL (ref 0.0–0.1)
Basophils Relative: 1 %
Eosinophils Absolute: 0.3 10*3/uL (ref 0.0–0.5)
Eosinophils Relative: 3 %
HCT: 46.6 % (ref 39.0–52.0)
Hemoglobin: 14.9 g/dL (ref 13.0–17.0)
Immature Granulocytes: 1 %
Lymphocytes Relative: 19 %
Lymphs Abs: 1.6 10*3/uL (ref 0.7–4.0)
MCH: 28.6 pg (ref 26.0–34.0)
MCHC: 32 g/dL (ref 30.0–36.0)
MCV: 89.4 fL (ref 80.0–100.0)
Monocytes Absolute: 1 10*3/uL (ref 0.1–1.0)
Monocytes Relative: 12 %
Neutro Abs: 5.4 10*3/uL (ref 1.7–7.7)
Neutrophils Relative %: 64 %
Platelets: 346 10*3/uL (ref 150–400)
RBC: 5.21 MIL/uL (ref 4.22–5.81)
RDW: 14.2 % (ref 11.5–15.5)
WBC: 8.6 10*3/uL (ref 4.0–10.5)
nRBC: 0 % (ref 0.0–0.2)

## 2024-01-19 LAB — RESP PANEL BY RT-PCR (RSV, FLU A&B, COVID)  RVPGX2
Influenza A by PCR: POSITIVE — AB
Influenza B by PCR: NEGATIVE
Resp Syncytial Virus by PCR: NEGATIVE
SARS Coronavirus 2 by RT PCR: NEGATIVE

## 2024-01-19 MED ORDER — CEPHALEXIN 500 MG PO CAPS
500.0000 mg | ORAL_CAPSULE | Freq: Once | ORAL | Status: DC
  Filled 2024-01-19: qty 1

## 2024-01-19 MED ORDER — CEPHALEXIN 500 MG PO CAPS: 500.0000 mg | ORAL_CAPSULE | Freq: Four times a day (QID) | ORAL | 0 refills | Status: DC

## 2024-01-19 MED ORDER — CEPHALEXIN 500 MG PO CAPS
500.0000 mg | ORAL_CAPSULE | Freq: Once | ORAL | Status: AC
  Administered 2024-01-19: 500 mg via ORAL

## 2024-01-19 MED ORDER — SODIUM CHLORIDE 0.9 % IV BOLUS
1000.0000 mL | Freq: Once | INTRAVENOUS | Status: AC
  Administered 2024-01-19: 1000 mL via INTRAVENOUS

## 2024-01-19 NOTE — Discharge Instructions (Signed)
 You were seen in the emergency department for concern of infection Based on your blood work and the appearance of your wound, there is no need for you to stay in the hospital for antibiotic treatment We have prescribed a 5-day course of Keflex for you to pick up from your pharmacy and begin taking as directed Return to the emergency department for fevers, draining pus, severe pain or any other concerns Otherwise follow-up with your primary care doctor and Dr.Stoneking in the office for reevaluation

## 2024-01-19 NOTE — ED Triage Notes (Signed)
 Patient said he got an abscess lanced about 5 days ago on his scrotum. Left because the wait was too long. Stated he needs antibiotics. Is having bloody drainage. Denies pus.

## 2024-01-19 NOTE — ED Provider Notes (Signed)
 [place] EMERGENCY DEPARTMENT AT [facility] Provider Note   CSN: [id number] Arrival date & time: 01/19/24  1011     History  Chief Complaint  Patient presents with   Requesting Antibiotics    Ryan Lester is a 28 y.o. male.  Who presents to the ED given concern of infection.  Patient underwent incision and drainage of scrotal abscess on February 27 with Dr. Pete Glatter.  Since that time he has been managing his wound at home.  Was seen in urgent care today requesting antibiotics but was directed here given concern for potential severe infection.  He has not had any fevers, draining pus or other complaints at this time.  Urinating without issue.  HPI     Home Medications Prior to Admission medications   Medication Sig Start Date End Date Taking? Authorizing Provider  cephALEXin (KEFLEX) 500 MG capsule Take 1 capsule (500 mg total) by mouth 4 (four) times daily. 01/19/24  Yes Royanne Foots, DO  HYDROcodone-acetaminophen (NORCO/VICODIN) 5-325 MG tablet Take 1-2 tablets by mouth every 6 (six) hours as needed for moderate pain (pain score 4-6) or severe pain (pain score 7-10). Patient not taking: Reported on 01/14/2024 12/22/23   Maurilio Lovely D, DO  albuterol (PROVENTIL HFA;VENTOLIN HFA) 108 (90 Base) MCG/ACT inhaler Inhale 1-2 puffs into the lungs every 6 (six) hours as needed for wheezing. 02/25/18 04/10/20  Rolland Porter, MD      Allergies    Sulfa antibiotics    Review of Systems   Review of Systems  Physical Exam Updated Vital Signs BP (!) 165/108   Pulse (!) 102   Temp 98.3 F (36.8 C) (Oral)   Resp 18   Ht 6' (1.829 m)   Wt 136.1 kg   SpO2 95%   BMI 40.69 kg/m  Physical Exam Vitals and nursing note reviewed.  HENT:     Head: Normocephalic and atraumatic.  Eyes:     Pupils: Pupils are equal, round, and reactive to light.  Cardiovascular:     Rate and Rhythm: Normal rate and regular rhythm.  Pulmonary:     Effort: Pulmonary effort is normal.      Breath sounds: Normal breath sounds.  Abdominal:     Palpations: Abdomen is soft.     Tenderness: There is no abdominal tenderness.  Genitourinary:    Comments: Open wound over right scrotum appears clean and dry without purulent drainage or severe erythema Skin:    General: Skin is warm and dry.  Neurological:     Mental Status: He is alert.  Psychiatric:        Mood and Affect: Mood normal.     ED Results / Procedures / Treatments   Labs (all labs ordered are listed, but only abnormal results are displayed) Labs Reviewed  COMPREHENSIVE METABOLIC PANEL - Abnormal; Notable for the following components:      Result Value   Total Protein 8.7 (*)    All other components within normal limits  CBC WITH DIFFERENTIAL/PLATELET - Abnormal; Notable for the following components:   Abs Immature Granulocytes 0.10 (*)    All other components within normal limits  CULTURE, BLOOD (ROUTINE X 2)  CULTURE, BLOOD (ROUTINE X 2)  RESP PANEL BY RT-PCR (RSV, FLU A&B, COVID)  [id number]  LACTIC ACID, PLASMA  LACTIC ACID, PLASMA    EKG None  Radiology No results found.  Procedures Procedures    Medications Ordered in ED Medications  sodium chloride 0.9 % bolus 1,000  mL (0 mLs Intravenous Stopped 01/19/24 1415)  cephALEXin (KEFLEX) capsule 500 mg (500 mg Oral Given 01/19/24 1431)    ED Course/ Medical Decision Making/ A&P Clinical Course as of 01/19/24 1432  Tue Jan 19, 2024  1432 No elevated leukocytosis or elevation in venous lactate.  Patient would be appropriate for outpatient antibiotics and will follow-up with his PCP and urology specialist [MP]    Clinical Course User Index [MP] Royanne Foots, DO                                 Medical Decision Making 28 year old male with history as above presenting after incision and drainage of right scrotal abscess by Dr. Pete Glatter on February 27.  Voices concern for potential infection.  Afebrile and well-appearing.  Surgical site appears very  well without significant erythema or purulent drainage.  Will obtain laboratory workup to evaluate for potential severe infection however suspect he can go home on p.o. antibiotics  Amount and/or Complexity of Data Reviewed Labs: ordered.  Risk Prescription drug management.           Final Clinical Impression(s) / ED Diagnoses Final diagnoses:  Scrotal abscess    Rx / DC Orders ED Discharge Orders          Ordered    cephALEXin (KEFLEX) 500 MG capsule  4 times daily        01/19/24 1429              Royanne Foots, DO 01/19/24 1432

## 2024-01-20 LAB — AEROBIC/ANAEROBIC CULTURE W GRAM STAIN (SURGICAL/DEEP WOUND)

## 2024-01-24 LAB — CULTURE, BLOOD (ROUTINE X 2)
Culture: NO GROWTH
Special Requests: ADEQUATE

## 2024-03-24 ENCOUNTER — Encounter (HOSPITAL_COMMUNITY): Payer: Self-pay

## 2024-03-24 ENCOUNTER — Other Ambulatory Visit: Payer: Self-pay

## 2024-03-24 ENCOUNTER — Emergency Department (HOSPITAL_COMMUNITY): Payer: Self-pay

## 2024-03-24 ENCOUNTER — Emergency Department (HOSPITAL_COMMUNITY)
Admission: EM | Admit: 2024-03-24 | Discharge: 2024-03-24 | Disposition: A | Discharge status: Home / Self Care | Payer: Self-pay | Attending: Emergency Medicine | Admitting: Emergency Medicine

## 2024-03-24 DIAGNOSIS — L0390 Cellulitis, unspecified: Secondary | ICD-10-CM

## 2024-03-24 DIAGNOSIS — R030 Elevated blood-pressure reading, without diagnosis of hypertension: Secondary | ICD-10-CM | POA: Insufficient documentation

## 2024-03-24 DIAGNOSIS — N492 Inflammatory disorders of scrotum: Secondary | ICD-10-CM | POA: Insufficient documentation

## 2024-03-24 LAB — CBC WITH DIFFERENTIAL/PLATELET
Abs Immature Granulocytes: 0.05 10*3/uL (ref 0.00–0.07)
Basophils Absolute: 0.1 10*3/uL (ref 0.0–0.1)
Basophils Relative: 1 %
Eosinophils Absolute: 0.2 10*3/uL (ref 0.0–0.5)
Eosinophils Relative: 2 %
HCT: 45.3 % (ref 39.0–52.0)
Hemoglobin: 14.6 g/dL (ref 13.0–17.0)
Immature Granulocytes: 1 %
Lymphocytes Relative: 26 %
Lymphs Abs: 2.5 10*3/uL (ref 0.7–4.0)
MCH: 29.1 pg (ref 26.0–34.0)
MCHC: 32.2 g/dL (ref 30.0–36.0)
MCV: 90.2 fL (ref 80.0–100.0)
Monocytes Absolute: 0.7 10*3/uL (ref 0.1–1.0)
Monocytes Relative: 7 %
Neutro Abs: 6.2 10*3/uL (ref 1.7–7.7)
Neutrophils Relative %: 63 %
Platelets: 346 10*3/uL (ref 150–400)
RBC: 5.02 MIL/uL (ref 4.22–5.81)
RDW: 13.9 % (ref 11.5–15.5)
WBC: 9.7 10*3/uL (ref 4.0–10.5)
nRBC: 0 % (ref 0.0–0.2)

## 2024-03-24 LAB — COMPREHENSIVE METABOLIC PANEL WITH GFR
ALT: 32 U/L (ref 0–44)
AST: 31 U/L (ref 15–41)
Albumin: 3.5 g/dL (ref 3.5–5.0)
Alkaline Phosphatase: 53 U/L (ref 38–126)
Anion gap: 10 (ref 5–15)
BUN: 10 mg/dL (ref 6–20)
CO2: 21 mmol/L — ABNORMAL LOW (ref 22–32)
Calcium: 8.9 mg/dL (ref 8.9–10.3)
Chloride: 105 mmol/L (ref 98–111)
Creatinine, Ser: 1.12 mg/dL (ref 0.61–1.24)
GFR, Estimated: >60 mL/min (ref >60)
Glucose, Bld: 88 mg/dL (ref 70–99)
Potassium: 4.1 mmol/L (ref 3.5–5.1)
Sodium: 136 mmol/L (ref 135–145)
Total Bilirubin: 0.7 mg/dL (ref 0.0–1.2)
Total Protein: 8.3 g/dL — ABNORMAL HIGH (ref 6.5–8.1)

## 2024-03-24 LAB — I-STAT CG4 LACTIC ACID, ED: Lactic Acid, Venous: 0.8 mmol/L (ref 0.5–1.9)

## 2024-03-24 MED ORDER — CLINDAMYCIN HCL 300 MG PO CAPS: 300.0000 mg | ORAL_CAPSULE | Freq: Four times a day (QID) | ORAL | 0 refills | Status: AC

## 2024-03-24 MED ORDER — CLINDAMYCIN PHOSPHATE 600 MG/50ML IV SOLN
600.0000 mg | Freq: Once | INTRAVENOUS | Status: AC
  Administered 2024-03-24: 600 mg via INTRAVENOUS
  Filled 2024-03-24: qty 50

## 2024-03-24 NOTE — ED Triage Notes (Signed)
 Pt to er, pt states that he has an abscess on his scrotum, states that he thinks that the abscess may have popped, but his is till having some scrotal swelling.  Denies urinary problems. Pt states that he has had the swelling for the past week and a half

## 2024-03-24 NOTE — ED Provider Notes (Signed)
 [place] [facility]MENT AT [facility] Provider Note   CSN: [id number] Arrival date & time: 03/24/24  1201     History  Chief Complaint  Patient presents with   Abscess    Ryan Lester is a 28 y.o. male.  28 year old male with complaint of recurrence of his right scrotal abscess. Patient reports this has been drained several times, symptoms returned about a week ago. Area drained today but swelling and pain persist. No fevers. No history of diabetes or HTN.   Seen originially 02/06/22 and taken to the OR with Dr. Derrick Fling. Returned 02/07/23 US  showed hydrocele, concern for cellulitis and treated with abx. Returned 12/20/23 with abscess drained in the OR by Dr. Cathi Cluster.  Returned 01/10/24 2 weeks post op with return of pain/swelling, eloped pending CT for evaluation after US  negative. Returned 01/14/24 and was taken to the OR by Dr. Willye Harvey.  Returned to ER 01/19/24 labs reassuring, dc after IV abx with rx for abx.      Home Medications Prior to Admission medications   Medication Sig Start Date End Date Taking? Authorizing Provider  clindamycin  (CLEOCIN ) 300 MG capsule Take 1 capsule (300 mg total) by mouth every 6 (six) hours for 10 days. 03/24/24 04/03/24 Yes Darlis Eisenmenger, PA-C  cephALEXin  (KEFLEX ) 500 MG capsule Take 1 capsule (500 mg total) by mouth 4 (four) times daily. Patient not taking: Reported on 03/24/2024 01/19/24   Rafael Bun A, DO  HYDROcodone -acetaminophen  (NORCO/VICODIN) 5-325 MG tablet Take 1-2 tablets by mouth every 6 (six) hours as needed for moderate pain (pain score 4-6) or severe pain (pain score 7-10). Patient not taking: Reported on 01/14/2024 12/22/23   Doreene Gammon D, DO  albuterol  (PROVENTIL  HFA;VENTOLIN  HFA) 108 (90 Base) MCG/ACT inhaler Inhale 1-2 puffs into the lungs every 6 (six) hours as needed for wheezing. 02/25/18 04/10/20  Eino Gravel, MD      Allergies    Sulfa antibiotics    Review of Systems   Review of Systems Negative except  as per HPI Physical Exam Updated Vital Signs BP (!) 159/103   Pulse 94   Temp 99 F (37.2 C) (Oral)   Resp 15   Ht 6' (1.829 m)   Wt 136.1 kg   SpO2 98%   BMI 40.69 kg/m  Physical Exam Vitals and nursing note reviewed. Exam conducted with a chaperone present.  Constitutional:      General: He is not in acute distress.    Appearance: He is well-developed. He is not diaphoretic.  HENT:     Head: Normocephalic and atraumatic.  Pulmonary:     Effort: Pulmonary effort is normal.  Abdominal:     Palpations: Abdomen is soft.     Tenderness: There is no abdominal tenderness.  Genitourinary:    Comments: Fullness to scrotum with right side erythema and tenderness, no active drainage, no discernable fluctuance. Prior incision to right side scrotum dehiscence vs poor healing course, no active drainage.  Skin:    General: Skin is warm and dry.  Neurological:     Mental Status: He is alert and oriented to person, place, and time.  Psychiatric:        Behavior: Behavior normal.     ED Results / Procedures / Treatments   Labs (all labs ordered are listed, but only abnormal results are displayed) Labs Reviewed  COMPREHENSIVE METABOLIC PANEL WITH GFR - Abnormal; Notable for the following components:      Result Value  CO2 21 (*)    Total Protein 8.3 (*)    All other components within normal limits  CBC WITH DIFFERENTIAL/PLATELET  I-STAT CG4 LACTIC ACID, ED    EKG None  Radiology US  SCROTUM W/DOPPLER Result Date: 03/24/2024 CLINICAL DATA:  Right scrotal swelling.  Recent abscess. EXAM: SCROTAL ULTRASOUND DOPPLER ULTRASOUND OF THE TESTICLES TECHNIQUE: Complete ultrasound examination of the testicles, epididymis, and other scrotal structures was performed. Color and spectral Doppler ultrasound were also utilized to evaluate blood flow to the testicles. COMPARISON:  Ultrasound dated 01/13/2024. FINDINGS: Right testicle Measurements: 4.9 x 2.3 x 3.2 cm. No mass or microlithiasis  visualized. Left testicle Measurements: 4.8 x 2.4 x 2.8 cm. No mass or microlithiasis visualized. Right epididymis:  Normal in size and appearance. Left epididymis:  Normal in size and appearance. Hydrocele:  None visualized. Varicocele:  None visualized. Pulsed Doppler interrogation of both testes demonstrates normal low resistance arterial and venous waveforms bilaterally. There is heterogeneous and thickening of the right scrotal wall with mild hyperemia. Significant interval decrease in the size of the previously seen collection now measuring approximately 1.6 x 0.7 cm. IMPRESSION: 1. Unremarkable testicles. 2. Findings most consistent with cellulitis of the right scrotum with interval decrease in the right scrotal fluid collection. Electronically Signed   By: Angus Bark M.D.   On: 03/24/2024 13:43    Procedures Procedures    Medications Ordered in ED Medications  clindamycin  (CLEOCIN ) IVPB 600 mg (has no administration in time range)    ED Course/ Medical Decision Making/ A&P                                 Medical Decision Making Amount and/or Complexity of Data Reviewed Labs: ordered. Radiology: ordered.  Risk Prescription drug management.   This patient presents to the ED for concern of scrotal pain and swelling, this involves an extensive number of treatment options, and is a complaint that carries with it a high risk of complications and morbidity.  The differential diagnosis includes abscess, cellulitis, hydrocele    Co morbidities that complicate the patient evaluation  Recurrent abscess   Additional history obtained:  Additional history obtained from wife via phone External records from outside source obtained and reviewed including records reviewed as noted in HPI   Lab Tests:  I Ordered, and personally interpreted labs.  The pertinent results include:  CBC, CMP, lactic, no significant findings   Imaging Studies ordered:  I ordered imaging studies  including US  scrotum   I independently visualized and interpreted imaging which showed no significant drainable collection I agree with the radiologist interpretation- suggests cellulitis    Problem List / ED Course / Critical interventions / Medication management  28 year old male with complaint of scrotal pain with concern for recurrent abscess. On exam, swelling with erythema, no active drainage, no fluctuance.  Patient is well-appearing, nontoxic and in no distress.  He is afebrile.  BBC normal, lactic acid normal.  Ultrasound suggesting cellulitis.  Discussed with ER attending, Dr. Martina Sledge who has seen the patient.  Agrees with plan for treatment of cellulitis and referral to urology with return to ER precautions. I ordered medication including clindamycin   for cellulitis   Reevaluation of the patient after these medicines showed that the patient stayed the same I have reviewed the patients home medicines and have made adjustments as needed   Consultations Obtained:  I requested consultation with the ER attending,  Dr. Martina Sledge,  and discussed lab and imaging findings as well as pertinent plan - they recommend: has seen the patient, recommends treat cellulitis, follow up with urology    Social Determinants of Health:  No PCP on file   Test / Admission - Considered:  Stable for dc         Final Clinical Impression(s) / ED Diagnoses Final diagnoses:  Elevated blood pressure reading  Cellulitis, unspecified cellulitis site    Rx / DC Orders ED Discharge Orders          Ordered    clindamycin  (CLEOCIN ) 300 MG capsule  Every 6 hours        03/24/24 1516              Darlis Eisenmenger, PA-C 03/24/24 1526    Teddi Favors, DO 03/25/24 [phone]

## 2024-03-24 NOTE — Discharge Instructions (Signed)
 Take antibiotics as prescribed. Apply warm compresses for 20 minutes at a time.  Follow up with urology, please call today to schedule an appointment.   Return to the ER for fevers, worsening or concerning symptoms.

## 2024-05-27 ENCOUNTER — Other Ambulatory Visit: Payer: Self-pay

## 2024-05-27 ENCOUNTER — Emergency Department (HOSPITAL_COMMUNITY)
Admission: EM | Admit: 2024-05-27 | Discharge: 2024-05-27 | Disposition: A | Discharge status: Home / Self Care | Payer: Self-pay | Attending: Emergency Medicine | Admitting: Emergency Medicine

## 2024-05-27 ENCOUNTER — Encounter (HOSPITAL_COMMUNITY): Payer: Self-pay

## 2024-05-27 ENCOUNTER — Emergency Department (HOSPITAL_COMMUNITY): Payer: Self-pay

## 2024-05-27 DIAGNOSIS — N492 Inflammatory disorders of scrotum: Secondary | ICD-10-CM | POA: Insufficient documentation

## 2024-05-27 LAB — CBC WITH DIFFERENTIAL/PLATELET
Abs Immature Granulocytes: 0.06 K/uL (ref 0.00–0.07)
Basophils Absolute: 0.1 K/uL (ref 0.0–0.1)
Basophils Relative: 1 %
Eosinophils Absolute: 0.1 K/uL (ref 0.0–0.5)
Eosinophils Relative: 1 %
HCT: 46 % (ref 39.0–52.0)
Hemoglobin: 14.5 g/dL (ref 13.0–17.0)
Immature Granulocytes: 0 %
Lymphocytes Relative: 13 %
Lymphs Abs: 1.9 K/uL (ref 0.7–4.0)
MCH: 28.8 pg (ref 26.0–34.0)
MCHC: 31.5 g/dL (ref 30.0–36.0)
MCV: 91.3 fL (ref 80.0–100.0)
Monocytes Absolute: 0.8 K/uL (ref 0.1–1.0)
Monocytes Relative: 5 %
Neutro Abs: 12.3 K/uL — ABNORMAL HIGH (ref 1.7–7.7)
Neutrophils Relative %: 80 %
Platelets: 251 K/uL (ref 150–400)
RBC: 5.04 MIL/uL (ref 4.22–5.81)
RDW: 14.3 % (ref 11.5–15.5)
WBC: 15.2 K/uL — ABNORMAL HIGH (ref 4.0–10.5)
nRBC: 0 % (ref 0.0–0.2)

## 2024-05-27 LAB — COMPREHENSIVE METABOLIC PANEL WITH GFR
ALT: 34 U/L (ref 0–44)
AST: 25 U/L (ref 15–41)
Albumin: 4 g/dL (ref 3.5–5.0)
Alkaline Phosphatase: 52 U/L (ref 38–126)
Anion gap: 10 (ref 5–15)
BUN: 9 mg/dL (ref 6–20)
CO2: 23 mmol/L (ref 22–32)
Calcium: 9.3 mg/dL (ref 8.9–10.3)
Chloride: 103 mmol/L (ref 98–111)
Creatinine, Ser: 1.39 mg/dL — ABNORMAL HIGH (ref 0.61–1.24)
GFR, Estimated: >60 mL/min (ref >60)
Glucose, Bld: 135 mg/dL — ABNORMAL HIGH (ref 70–99)
Potassium: 3.3 mmol/L — ABNORMAL LOW (ref 3.5–5.1)
Sodium: 136 mmol/L (ref 135–145)
Total Bilirubin: 1.4 mg/dL — ABNORMAL HIGH (ref 0.0–1.2)
Total Protein: 8.2 g/dL — ABNORMAL HIGH (ref 6.5–8.1)

## 2024-05-27 LAB — I-STAT CG4 LACTIC ACID, ED: Lactic Acid, Venous: 1.5 mmol/L (ref 0.5–1.9)

## 2024-05-27 MED ORDER — SODIUM CHLORIDE 0.9 % IV BOLUS
1000.0000 mL | Freq: Once | INTRAVENOUS | Status: AC
  Administered 2024-05-27: 1000 mL via INTRAVENOUS

## 2024-05-27 MED ORDER — POTASSIUM CHLORIDE CRYS ER 20 MEQ PO TBCR
40.0000 meq | EXTENDED_RELEASE_TABLET | Freq: Once | ORAL | Status: AC
Start: 2024-05-27 — End: 2024-05-27
  Administered 2024-05-27: 40 meq via ORAL
  Filled 2024-05-27: qty 2

## 2024-05-27 MED ORDER — CLINDAMYCIN HCL 150 MG PO CAPS: 150.0000 mg | ORAL_CAPSULE | Freq: Four times a day (QID) | ORAL | 0 refills | Status: DC

## 2024-05-27 MED ORDER — SODIUM CHLORIDE 0.9 % IV SOLN
1.0000 g | Freq: Once | INTRAVENOUS | Status: AC
  Administered 2024-05-27: 1 g via INTRAVENOUS
  Filled 2024-05-27: qty 10

## 2024-05-27 NOTE — ED Provider Notes (Signed)
 [place] [facility]ENC[facility] DEPARTMENT AT [facility] Provider Note   CSN: [id number] Arrival date & time: 05/27/24  1236     Patient presents with: Abscess   Hasan D Portell is a 28 y.o. male.   Patient with history of recurrent scrotal abscess presents today with complaints of scrotal abscess. He states that he has had some pain and swelling at the site for the past 2 days. Reports pain is in the typical place where he gets his abscesses. He has had operative resection previously, last time was in February. Reports that he is unable to afford the copay to follow-up with urology. Denies fevers or chills. No dysuria or penile discharge.   The history is provided by the patient. No language interpreter was used.  Abscess      Prior to Admission medications   Medication Sig Start Date End Date Taking? Authorizing Provider  cephALEXin  (KEFLEX ) 500 MG capsule Take 1 capsule (500 mg total) by mouth 4 (four) times daily. Patient not taking: Reported on 03/24/2024 01/19/24   Pamella Ozell LABOR, DO  HYDROcodone -acetaminophen  (NORCO/VICODIN) 5-325 MG tablet Take 1-2 tablets by mouth every 6 (six) hours as needed for moderate pain (pain score 4-6) or severe pain (pain score 7-10). Patient not taking: Reported on 01/14/2024 12/22/23   Maree Bracken D, DO  albuterol  (PROVENTIL  HFA;VENTOLIN  HFA) 108 (90 Base) MCG/ACT inhaler Inhale 1-2 puffs into the lungs every 6 (six) hours as needed for wheezing. 02/25/18 04/10/20  Lynwood Anes, MD    Allergies: Sulfa antibiotics    Review of Systems  All other systems reviewed and are negative.   Updated Vital Signs BP (!) 140/95 (BP Location: Right Arm)   Pulse (!) 121   Temp 99.1 F (37.3 C) (Oral)   Resp 16   Ht 6' (1.829 m)   Wt (!) 136.1 kg   SpO2 98%   BMI 40.69 kg/m   Physical Exam Vitals and nursing note reviewed. Exam conducted with a chaperone present.  Constitutional:      General: He is not in acute distress.    Appearance: Normal  appearance. He is normal weight. He is not ill-appearing, toxic-appearing or diaphoretic.  HENT:     Head: Normocephalic and atraumatic.  Cardiovascular:     Rate and Rhythm: Normal rate.  Pulmonary:     Effort: Pulmonary effort is normal. No respiratory distress.  Genitourinary:    Comments: Right testicle with well healed surgical scar present with erythema, warmth, and induration. No obvious fluctuance or drainable collection Musculoskeletal:        General: Normal range of motion.     Cervical back: Normal range of motion.  Skin:    General: Skin is warm and dry.  Neurological:     General: No focal deficit present.     Mental Status: He is alert.  Psychiatric:        Mood and Affect: Mood normal.        Behavior: Behavior normal.     (all labs ordered are listed, but only abnormal results are displayed) Labs Reviewed  CBC WITH DIFFERENTIAL/PLATELET - Abnormal; Notable for the following components:      Result Value   WBC 15.2 (*)    Neutro Abs 12.3 (*)    All other components within normal limits  COMPREHENSIVE METABOLIC PANEL WITH GFR - Abnormal; Notable for the following components:   Potassium 3.3 (*)    Glucose, Bld 135 (*)    Creatinine, Ser 1.39 (*)  Total Protein 8.2 (*)    Total Bilirubin 1.4 (*)    All other components within normal limits  I-STAT CG4 LACTIC ACID, ED    EKG: None  Radiology: US  SCROTUM W/DOPPLER Result Date: 05/27/2024 CLINICAL DATA:  Scrotal abscess. EXAM: SCROTAL ULTRASOUND DOPPLER ULTRASOUND OF THE TESTICLES TECHNIQUE: Complete ultrasound examination of the testicles, epididymis, and other scrotal structures was performed. Color and spectral Doppler ultrasound were also utilized to evaluate blood flow to the testicles. COMPARISON:  Mar 24, 2024. FINDINGS: Right testicle Measurements: 4.7 x 3.5 x 2.6 cm. No mass or microlithiasis visualized. Left testicle Measurements: 3.9 x 3.1 x 2.4 cm. No mass or microlithiasis visualized. Right  epididymis:  Normal in size and appearance. Left epididymis:  Normal in size and appearance. Hydrocele:  Small right hydrocele. Varicocele:  None visualized. Pulsed Doppler interrogation of both testes demonstrates normal low resistance arterial and venous waveforms bilaterally. Right scrotal wall thickening is noted without discrete fluid collection. IMPRESSION: No evidence of testicular mass or torsion. Small right hydrocele. Right scrotal wall thickening or edema is noted without discrete fluid collection. Electronically Signed   By: Lynwood Landy Raddle M.D.   On: 05/27/2024 14:59     Procedures   Medications Ordered in the ED - No data to display                                  Medical Decision Making Amount and/or Complexity of Data Reviewed Labs: ordered. Radiology: ordered.   This patient is a 28 y.o. male who presents to the ED for concern of scrotal pain, this involves an extensive number of treatment options, and is a complaint that carries with it a high risk of complications and morbidity. The emergent differential diagnosis prior to evaluation includes, but is not limited to,  testicular cellulitis, abscess    This is not an exhaustive differential.   Past Medical History / Co-morbidities / Social History:  has a past medical history of Hypersomnia, unspecified, Injury, other and unspecified, elbow, forearm, and wrist, Memory deficit (09/12/2013), Memory loss, Obesity, Other acne, Pain in joint, ankle and foot, and Pain in joint, lower leg.  Additional history: Chart reviewed. Pertinent results include: Has had several recurrence of abscesses in this area previously, had resection in February.  Physical Exam: Physical exam performed. The pertinent findings include: Well-appearing, right testicle with well healed surgical scar present with erythema, warmth, and induration. No obvious fluctuance or drainable collection  Lab Tests: I ordered, and personally interpreted labs.   The pertinent results include:  WBC 15.2, K 3.3, creatinine 1.39 (most recent 1.12). Lactic WNL   Imaging Studies: I ordered imaging studies including scrotal US . I independently visualized and interpreted imaging which showed   No evidence of testicular mass or torsion.   Small right hydrocele.   Right scrotal wall thickening or edema is noted without discrete fluid collection.   I agree with the radiologist interpretation.  Medications: I ordered medication including Rocephin , oral potassium, fluids  for scrotal cellulitis, dehydration, hypokalemia. Reevaluation of the patient after these medicines showed that the patient improved. I have reviewed the patients home medicines and have made adjustments as needed.  Consultations Obtained: I requested consultation with the urology on call Dr. Francisca,  and discussed lab and imaging findings as well as pertinent plan - they recommend: they will help facilitate outpatient follow-up   Disposition: After consideration of the diagnostic results  and the patients response to treatment, I feel that emergency department workup does not suggest an emergent condition requiring admission or immediate intervention beyond what has been performed at this time. The plan is: discharge with clindamycin  for management of scrotal cellulitis, close outpatient follow-up and return precautions. Evaluation and diagnostic testing in the emergency department does not suggest an emergent condition requiring admission or immediate intervention beyond what has been performed at this time.  Plan for discharge with close PCP follow-up.  Patient is understanding and amenable with plan, educated on red flag symptoms that would prompt immediate return.  Patient discharged in stable condition.   This is a shared visit with supervising physician Dr. Suzette who has independently evaluated patient & provided guidance in evaluation/management/disposition, in agreement with care    Final diagnoses:  Cellulitis of scrotum    ED Discharge Orders          Ordered    clindamycin  (CLEOCIN ) 150 MG capsule  Every 6 hours        05/27/24 1531          An After Visit Summary was printed and given to the patient.      Briza Bark A, PA-C 05/27/24 1533    Suzette Pac, MD 05/28/24 [phone]

## 2024-05-27 NOTE — Discharge Instructions (Signed)
 As we discussed, your workup in the ER today was overall reassuring for acute findings.  Ultrasound of your scrotum did show signs of cellulitis but did not show a drainable abscess.  Given this, there is no indication for any additional intervention at this time.  We gave you 1 dose of antibiotics today and I have written you a prescription for IV antibiotics that you need to fill and take as prescribed in's entirety for management of your symptoms.  Additionally, I reached out to urology and they should reach out to you early next week to help facilitate an outpatient follow-up.  It is very important that you make an appointment to follow-up with them.  In the future, when this area is healed, you may purchase Hibiclens  which is a soap you can clean the area with to help prevent recurrence.  Please only do it a few times a month as it can dry your skin out.  Return if development of any new or worsening symptoms.

## 2024-05-27 NOTE — ED Triage Notes (Signed)
 Pt arrived reporting abscess on R testicle. States has been there for 3 days. Denies fever drainage swelling or any other symptoms.

## 2024-05-30 ENCOUNTER — Other Ambulatory Visit: Payer: Self-pay

## 2024-05-30 ENCOUNTER — Inpatient Hospital Stay (HOSPITAL_COMMUNITY)
Admission: EM | Admit: 2024-05-30 | Discharge: 2024-06-01 | DRG: 854 | Disposition: A | Discharge status: Home / Self Care | Payer: Self-pay | Attending: Family Medicine | Admitting: Family Medicine

## 2024-05-30 ENCOUNTER — Encounter (HOSPITAL_COMMUNITY): Payer: Self-pay | Admitting: Family Medicine

## 2024-05-30 ENCOUNTER — Emergency Department (HOSPITAL_COMMUNITY): Payer: Self-pay

## 2024-05-30 DIAGNOSIS — E872 Acidosis, unspecified: Secondary | ICD-10-CM | POA: Diagnosis present

## 2024-05-30 DIAGNOSIS — N179 Acute kidney failure, unspecified: Secondary | ICD-10-CM | POA: Diagnosis present

## 2024-05-30 DIAGNOSIS — F17210 Nicotine dependence, cigarettes, uncomplicated: Secondary | ICD-10-CM | POA: Diagnosis present

## 2024-05-30 DIAGNOSIS — Z833 Family history of diabetes mellitus: Secondary | ICD-10-CM

## 2024-05-30 DIAGNOSIS — Z6841 Body Mass Index (BMI) 40.0 and over, adult: Secondary | ICD-10-CM

## 2024-05-30 DIAGNOSIS — A419 Sepsis, unspecified organism: Principal | ICD-10-CM | POA: Diagnosis present

## 2024-05-30 DIAGNOSIS — Z882 Allergy status to sulfonamides status: Secondary | ICD-10-CM

## 2024-05-30 DIAGNOSIS — N492 Inflammatory disorders of scrotum: Secondary | ICD-10-CM | POA: Diagnosis present

## 2024-05-30 DIAGNOSIS — L0291 Cutaneous abscess, unspecified: Principal | ICD-10-CM

## 2024-05-30 DIAGNOSIS — L0390 Cellulitis, unspecified: Secondary | ICD-10-CM

## 2024-05-30 DIAGNOSIS — Z8249 Family history of ischemic heart disease and other diseases of the circulatory system: Secondary | ICD-10-CM

## 2024-05-30 DIAGNOSIS — I160 Hypertensive urgency: Secondary | ICD-10-CM | POA: Diagnosis present

## 2024-05-30 LAB — BASIC METABOLIC PANEL WITH GFR
Anion gap: 9 (ref 5–15)
BUN: 9 mg/dL (ref 6–20)
CO2: 21 mmol/L — ABNORMAL LOW (ref 22–32)
Calcium: 9.4 mg/dL (ref 8.9–10.3)
Chloride: 107 mmol/L (ref 98–111)
Creatinine, Ser: 0.97 mg/dL (ref 0.61–1.24)
GFR, Estimated: >60 mL/min (ref >60)
Glucose, Bld: 101 mg/dL — ABNORMAL HIGH (ref 70–99)
Potassium: 4.1 mmol/L (ref 3.5–5.1)
Sodium: 137 mmol/L (ref 135–145)

## 2024-05-30 LAB — CBC WITH DIFFERENTIAL/PLATELET
Abs Immature Granulocytes: 0.06 K/uL (ref 0.00–0.07)
Basophils Absolute: 0.1 K/uL (ref 0.0–0.1)
Basophils Relative: 1 %
Eosinophils Absolute: 0.2 K/uL (ref 0.0–0.5)
Eosinophils Relative: 1 %
HCT: 46 % (ref 39.0–52.0)
Hemoglobin: 14.9 g/dL (ref 13.0–17.0)
Immature Granulocytes: 0 %
Lymphocytes Relative: 11 %
Lymphs Abs: 1.7 K/uL (ref 0.7–4.0)
MCH: 29.2 pg (ref 26.0–34.0)
MCHC: 32.4 g/dL (ref 30.0–36.0)
MCV: 90.2 fL (ref 80.0–100.0)
Monocytes Absolute: 1.2 K/uL — ABNORMAL HIGH (ref 0.1–1.0)
Monocytes Relative: 8 %
Neutro Abs: 12.2 K/uL — ABNORMAL HIGH (ref 1.7–7.7)
Neutrophils Relative %: 79 %
Platelets: 343 K/uL (ref 150–400)
RBC: 5.1 MIL/uL (ref 4.22–5.81)
RDW: 13.8 % (ref 11.5–15.5)
WBC: 15.4 K/uL — ABNORMAL HIGH (ref 4.0–10.5)
nRBC: 0 % (ref 0.0–0.2)

## 2024-05-30 MED ORDER — KETAMINE HCL 10 MG/ML IJ SOLN
INTRAMUSCULAR | Status: AC | PRN
  Administered 2024-05-30: 149 mg via INTRAVENOUS
  Administered 2024-05-30: 50 mg via INTRAVENOUS

## 2024-05-30 MED ORDER — SODIUM CHLORIDE 0.9 % IV SOLN: INTRAVENOUS | Status: AC

## 2024-05-30 MED ORDER — SODIUM CHLORIDE 0.9 % IV BOLUS
1000.0000 mL | Freq: Once | INTRAVENOUS | Status: AC
  Administered 2024-05-30: 1000 mL via INTRAVENOUS

## 2024-05-30 MED ORDER — POLYETHYLENE GLYCOL 3350 17 G PO PACK: 17.0000 g | PACK | Freq: Every day | ORAL | Status: DC | PRN

## 2024-05-30 MED ORDER — VANCOMYCIN HCL 1750 MG/350ML IV SOLN
1750.0000 mg | Freq: Two times a day (BID) | INTRAVENOUS | Status: DC
  Filled 2024-05-30: qty 350

## 2024-05-30 MED ORDER — ACETAMINOPHEN 650 MG RE SUPP: 650.0000 mg | Freq: Four times a day (QID) | RECTAL | Status: DC | PRN

## 2024-05-30 MED ORDER — OXYCODONE-ACETAMINOPHEN 5-325 MG PO TABS
1.0000 | ORAL_TABLET | Freq: Once | ORAL | Status: AC
  Administered 2024-05-30: 1 via ORAL
  Filled 2024-05-30: qty 1

## 2024-05-30 MED ORDER — ONDANSETRON HCL 4 MG PO TABS: 4.0000 mg | ORAL_TABLET | Freq: Four times a day (QID) | ORAL | Status: DC | PRN

## 2024-05-30 MED ORDER — HYDRALAZINE HCL 25 MG PO TABS: 25.0000 mg | ORAL_TABLET | Freq: Four times a day (QID) | ORAL | Status: DC | PRN

## 2024-05-30 MED ORDER — MORPHINE SULFATE (PF) 2 MG/ML IV SOLN
2.0000 mg | Freq: Once | INTRAVENOUS | Status: AC
  Administered 2024-05-30: 2 mg via INTRAVENOUS
  Filled 2024-05-30: qty 1

## 2024-05-30 MED ORDER — ACETAMINOPHEN 325 MG PO TABS: 650.0000 mg | ORAL_TABLET | Freq: Four times a day (QID) | ORAL | Status: DC | PRN

## 2024-05-30 MED ORDER — PIPERACILLIN-TAZOBACTAM 3.375 G IVPB
3.3750 g | Freq: Three times a day (TID) | INTRAVENOUS | Status: DC
  Administered 2024-05-31 – 2024-06-01 (×4): 3.375 g via INTRAVENOUS
  Filled 2024-05-30 (×4): qty 50

## 2024-05-30 MED ORDER — PIPERACILLIN-TAZOBACTAM 3.375 G IVPB 30 MIN
3.3750 g | Freq: Three times a day (TID) | INTRAVENOUS | Status: DC
  Administered 2024-05-30: 3.375 g via INTRAVENOUS
  Filled 2024-05-30: qty 50

## 2024-05-30 MED ORDER — IOHEXOL 300 MG/ML  SOLN
100.0000 mL | Freq: Once | INTRAMUSCULAR | Status: AC | PRN
  Administered 2024-05-30: 100 mL via INTRAVENOUS

## 2024-05-30 MED ORDER — LIDOCAINE HCL (PF) 1 % IJ SOLN
20.0000 mL | Freq: Once | INTRAMUSCULAR | Status: AC
  Administered 2024-05-30: 20 mL
  Filled 2024-05-30: qty 30

## 2024-05-30 MED ORDER — OXYCODONE HCL 5 MG PO TABS
5.0000 mg | ORAL_TABLET | ORAL | Status: DC | PRN
  Administered 2024-05-31 (×3): 5 mg via ORAL
  Filled 2024-05-30 (×3): qty 1

## 2024-05-30 MED ORDER — SODIUM CHLORIDE 0.9% FLUSH
3.0000 mL | Freq: Two times a day (BID) | INTRAVENOUS | Status: DC
  Administered 2024-05-31 (×2): 3 mL via INTRAVENOUS

## 2024-05-30 MED ORDER — KETAMINE HCL 50 MG/5ML IJ SOSY
PREFILLED_SYRINGE | INTRAMUSCULAR | Status: AC
  Filled 2024-05-30: qty 5

## 2024-05-30 MED ORDER — ONDANSETRON HCL 4 MG/2ML IJ SOLN: 4.0000 mg | Freq: Four times a day (QID) | INTRAMUSCULAR | Status: DC | PRN

## 2024-05-30 MED ORDER — VANCOMYCIN HCL 2000 MG/400ML IV SOLN
2000.0000 mg | Freq: Once | INTRAVENOUS | Status: AC
  Administered 2024-05-30: 2000 mg via INTRAVENOUS
  Filled 2024-05-30: qty 400

## 2024-05-30 MED ORDER — HYDROMORPHONE HCL 1 MG/ML IJ SOLN
0.5000 mg | INTRAMUSCULAR | Status: DC | PRN
  Administered 2024-05-30 – 2024-06-01 (×6): 1 mg via INTRAVENOUS
  Filled 2024-05-30 (×7): qty 1

## 2024-05-30 MED ORDER — KETAMINE HCL 50 MG/5ML IJ SOSY
1.0000 mg/kg | PREFILLED_SYRINGE | Freq: Once | INTRAMUSCULAR | Status: AC
  Administered 2024-05-30: 136 mg via INTRAVENOUS
  Filled 2024-05-30: qty 15
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
## 2024-05-30 NOTE — ED Provider Notes (Signed)
 [place] EMERGENCY DEPARTMENT AT [facility] Provider Note   CSN: [id number] Arrival date & time: 05/30/24  1159     Patient presents with: Abscess   Ryan Lester is a 28 y.o. male.    Abscess Associated symptoms: no fever and no vomiting    Presents due to concern for scrotal abscess.  Patient states he been compliant with his antibiotics at home.  States that has been have increasing swelling and pain along his testicles and underneath.  He feels like he might be draining slightly starting today.  No fever at home but does endorse chills.  No penile discharge.  Patient states has had a history of these large abscesses in the past states that have been drained.  No chest pain or shortness of breath.  No abdominal pain.  Previous medical history reviewed : Patient was seen because of scrotal abscess on July 11.  An ultrasound that showed right scrotal wall thickening or edema without discrete fluid collection at that time.  Patient was discharged home with clindamycin  for management of scrotal cellulitis.     Prior to Admission medications   Medication Sig Start Date End Date Taking? Authorizing Provider  cephALEXin  (KEFLEX ) 500 MG capsule Take 1 capsule (500 mg total) by mouth 4 (four) times daily. Patient not taking: Reported on 03/24/2024 01/19/24   Pamella Sharper A, DO  clindamycin  (CLEOCIN ) 150 MG capsule Take 1 capsule (150 mg total) by mouth every 6 (six) hours. 05/27/24   Smoot, Lauraine LABOR, PA-C  HYDROcodone -acetaminophen  (NORCO/VICODIN) 5-325 MG tablet Take 1-2 tablets by mouth every 6 (six) hours as needed for moderate pain (pain score 4-6) or severe pain (pain score 7-10). Patient not taking: Reported on 01/14/2024 12/22/23   Maree Bracken D, DO  albuterol  (PROVENTIL  HFA;VENTOLIN  HFA) 108 (90 Base) MCG/ACT inhaler Inhale 1-2 puffs into the lungs every 6 (six) hours as needed for wheezing. 02/25/18 04/10/20  Lynwood Anes, MD    Allergies: Sulfa antibiotics    Review of  Systems  Constitutional:  Negative for chills and fever.  HENT:  Negative for ear pain and sore throat.   Eyes:  Negative for pain and visual disturbance.  Respiratory:  Negative for cough and shortness of breath.   Cardiovascular:  Negative for chest pain and palpitations.  Gastrointestinal:  Negative for abdominal pain and vomiting.  Genitourinary:  Negative for dysuria and hematuria.  Musculoskeletal:  Negative for arthralgias and back pain.  Skin:  Negative for color change and rash.  Neurological:  Negative for seizures and syncope.  All other systems reviewed and are negative.   Updated Vital Signs BP (!) 159/98   Pulse (!) 122   Temp (!) 101.9 F (38.8 C) (Oral)   Resp (!) 21   Ht 6' (1.829 m)   Wt (!) 143.2 kg   SpO2 96%   BMI 42.82 kg/m   Physical Exam Vitals and nursing note reviewed.  Constitutional:      General: He is not in acute distress.    Appearance: He is well-developed.  HENT:     Head: Normocephalic and atraumatic.  Eyes:     Conjunctiva/sclera: Conjunctivae normal.  Cardiovascular:     Rate and Rhythm: Normal rate and regular rhythm.     Heart sounds: No murmur heard. Pulmonary:     Effort: Pulmonary effort is normal. No respiratory distress.     Breath sounds: Normal breath sounds.  Abdominal:     Palpations: Abdomen is soft.  Tenderness: There is no abdominal tenderness.  Genitourinary:   Musculoskeletal:        General: No swelling.     Cervical back: Neck supple.  Skin:    General: Skin is warm and dry.     Capillary Refill: Capillary refill takes less than 2 seconds.  Neurological:     Mental Status: He is alert.  Psychiatric:        Mood and Affect: Mood normal.     (all labs ordered are listed, but only abnormal results are displayed) Labs Reviewed  CBC WITH DIFFERENTIAL/PLATELET - Abnormal; Notable for the following components:      Result Value   WBC 15.4 (*)    Neutro Abs 12.2 (*)    Monocytes Absolute 1.2 (*)     All other components within normal limits  BASIC METABOLIC PANEL WITH GFR - Abnormal; Notable for the following components:   CO2 21 (*)    Glucose, Bld 101 (*)    All other components within normal limits  CULTURE, BLOOD (ROUTINE X 2)  CULTURE, BLOOD (ROUTINE X 2)    EKG: None  Radiology: CT ABDOMEN PELVIS W CONTRAST Result Date: 05/30/2024 CLINICAL DATA:  concern for right scrotal abscess that extends into the perineum as well. Eval size EXAM: CT ABDOMEN AND PELVIS WITH CONTRAST TECHNIQUE: Multidetector CT imaging of the abdomen and pelvis was performed using the standard protocol following bolus administration of intravenous contrast. RADIATION DOSE REDUCTION: This exam was performed according to the departmental dose-optimization program which includes automated exposure control, adjustment of the mA and/or kV according to patient size and/or use of iterative reconstruction technique. CONTRAST:  OMNIPAQUE  IOHEXOL  300 MG/ML  SOLN COMPARISON:  Ultrasound scrotum 05/30/2024 FINDINGS: Lower chest: No acute abnormality. Hepatobiliary: No focal liver abnormality. No gallstones, gallbladder wall thickening, or pericholecystic fluid. No biliary dilatation. Pancreas: No focal lesion. Normal pancreatic contour. No surrounding inflammatory changes. No main pancreatic ductal dilatation. Spleen: Normal in size without focal abnormality. Adrenals/Urinary Tract: No adrenal nodule bilaterally. Bilateral kidneys enhance symmetrically. No hydronephrosis. No hydroureter. The urinary bladder is unremarkable. Stomach/Bowel: Stomach is within normal limits. No evidence of bowel wall thickening or dilatation. Stool throughout the colon. Appendix appears normal. Vascular/Lymphatic: No abdominal aorta or iliac aneurysm. No abdominal, pelvic, or inguinal lymphadenopathy. Reproductive: Prostate is unremarkable. Diffuse scrotal subcutaneus soft tissue edema. No subcutaneus soft tissue emphysema. Developing peripherally  enhancing 1.8 x 1.9 x 2.9 cm poorly visualized fluid collection along the right scrotal wall. Other: No intraperitoneal free fluid. No intraperitoneal free gas. No organized fluid collection. Musculoskeletal: No abdominal wall hernia or abnormality. No suspicious lytic or blastic osseous lesions. No acute displaced fracture. IMPRESSION: 1. Diffuse bilateral scrotal edema with developing 2 x 2 x 3 cm right scrotal abscess. No subcutaneus soft tissue emphysema. Please note necrotizing fasciitis cannot be excluded as this is a clinical diagnosis. 2. Stool throughout the colon-correlate for constipation. Electronically Signed   By: Morgane  Naveau M.D.   On: 05/30/2024 20:23   US  SCROTUM W/DOPPLER Result Date: 05/30/2024 CLINICAL DATA:  86310 Swelling 13689 EXAM: SCROTAL ULTRASOUND DOPPLER ULTRASOUND OF THE TESTICLES TECHNIQUE: Complete ultrasound examination of the testicles, epididymis, and other scrotal structures was performed. Color and spectral Doppler ultrasound were also utilized to evaluate blood flow to the testicles. COMPARISON:  May 27, 2024, Mar 24, 2024, January 13, 2024 FINDINGS: Right testicle Measurements: 4.4 x 2.5 x 3 cm. No mass or microlithiasis visualized. Left testicle Measurements: 4.8 x 2.4 x 2.4  cm. No mass or microlithiasis visualized. Right epididymis:  Normal in size and appearance. Left epididymis:  Normal in size and appearance. Hydrocele:  Trace bilateral hydroceles. Varicocele:  None visualized. Pulsed Doppler interrogation of both testes demonstrates normal low resistance arterial and venous waveforms bilaterally. Diffuse thickening of the scrotal wall with heterogeneous collection in the lateral right scrotal wall, measuring 3 x 1.8 x 2.5 cm. Hyperemia again noted. Multiple hypoechoic structures in the inguinal regions bilaterally, measuring up to 1.6 cm in short axis dimension on the right and 1.4 cm on the left, possibly enlarged lymph nodes. IMPRESSION: 1. No acute sonographic  abnormality within the testicles; more specifically, no testicular mass, findings of epididymo-orchitis, or changes of testicular torsion, at this time. 2. Diffuse scrotal wall thickening. Heterogeneous collection in the lateral right scrotal wall, measuring 3 x 1.5 x 1.8 cm with hyperemia. This is worrisome for recurrent cellulitis with worsening phlegmon or developing abscess. Electronically Signed   By: Rogelia Myers M.D.   On: 05/30/2024 19:24     .Sedation  Date/Time: 05/30/2024 10:41 PM  Performed by: Simon Lavonia SAILOR, MD Authorized by: Simon Lavonia SAILOR, MD   Consent:    Consent obtained:  Verbal   Consent given by:  Patient   Risks discussed:  Allergic reaction, prolonged hypoxia resulting in organ damage, prolonged sedation necessitating reversal, dysrhythmia, inadequate sedation, respiratory compromise necessitating ventilatory assistance and intubation, nausea and vomiting   Alternatives discussed:  Anxiolysis and regional anesthesia Universal protocol:    Procedure explained and questions answered to patient or proxy's satisfaction: yes     Relevant documents present and verified: yes     Test results available: yes     Imaging studies available: yes     Required blood products, implants, devices, and special equipment available: yes     Site/side marked: yes     Immediately prior to procedure, a time out was called: yes     Patient identity confirmed:  Arm band Indications:    Procedure performed:  Incision and drainage   Procedure necessitating sedation performed by:  Different physician Pre-sedation assessment:    Time since last food or drink:  4 hours   ASA classification: class 1 - normal, healthy patient     Mouth opening:  2 finger widths   Thyromental distance:  3 finger widths   Mallampati score:  II - soft palate, uvula, fauces visible   Neck mobility: normal     Pre-sedation assessments completed and reviewed: airway patency, cardiovascular function, hydration  status, mental status, nausea/vomiting, pain level, respiratory function and temperature   A pre-sedation assessment was completed prior to the start of the procedure Immediate pre-procedure details:    Reassessment: Patient reassessed immediately prior to procedure     Reviewed: vital signs, relevant labs/tests and NPO status     Verified: bag valve mask available, emergency equipment available, intubation equipment available, IV patency confirmed, oxygen available and suction available   Procedure details (see MAR for exact dosages):    Preoxygenation:  Nasal cannula   Sedation:  Ketamine    Intended level of sedation: moderate (conscious sedation)   Intra-procedure monitoring:  Blood pressure monitoring, cardiac monitor, continuous pulse oximetry, continuous capnometry, frequent LOC assessments and frequent vital sign checks   Intra-procedure events: none     Total Provider sedation time (minutes):  20 Post-procedure details:   A post-sedation assessment was completed following the completion of the procedure.   Attendance: Constant attendance by certified staff until  patient recovered     Recovery: Patient returned to pre-procedure baseline     Post-sedation assessments completed and reviewed: airway patency, cardiovascular function, hydration status, mental status, nausea/vomiting, pain level, respiratory function and temperature     Patient is stable for discharge or admission: yes     Procedure completion:  Tolerated well, no immediate complications    Medications Ordered in the ED  piperacillin -tazobactam (ZOSYN ) IVPB 3.375 g (3.375 g Intravenous New Bag/Given 05/30/24 2214)  vancomycin  (VANCOREADY) IVPB 2000 mg/400 mL (2,000 mg Intravenous New Bag/Given 05/30/24 2210)  vancomycin  (VANCOREADY) IVPB 1750 mg/350 mL (has no administration in time range)  ketamine  HCl 50 MG/5ML SOSY (has no administration in time range)  oxyCODONE -acetaminophen  (PERCOCET/ROXICET) 5-325 MG per tablet 1  tablet (1 tablet Oral Given 05/30/24 1246)  oxyCODONE -acetaminophen  (PERCOCET/ROXICET) 5-325 MG per tablet 1 tablet (1 tablet Oral Given 05/30/24 1545)  sodium chloride  0.9 % bolus 1,000 mL (1,000 mLs Intravenous New Bag/Given 05/30/24 1847)  morphine  (PF) 2 MG/ML injection 2 mg (2 mg Intravenous Given 05/30/24 1847)  iohexol  (OMNIPAQUE ) 300 MG/ML solution 100 mL (100 mLs Intravenous Contrast Given 05/30/24 1938)  morphine  (PF) 2 MG/ML injection 2 mg (2 mg Intravenous Given 05/30/24 2016)  lidocaine  (PF) (XYLOCAINE ) 1 % injection 20 mL (20 mLs Other Given 05/30/24 2124)  ketamine  50 mg in normal saline 5 mL (10 mg/mL) syringe (136 mg Intravenous Given 05/30/24 2124)  sodium chloride  0.9 % bolus 1,000 mL (1,000 mLs Intravenous New Bag/Given 05/30/24 2125)  ketamine  (KETALAR ) injection (50 mg Intravenous Given 05/30/24 2147)  morphine  (PF) 2 MG/ML injection 2 mg (2 mg Intravenous Given 05/30/24 2207)                                    Medical Decision Making Amount and/or Complexity of Data Reviewed Radiology: ordered.  Risk Prescription drug management. Decision regarding hospitalization.    Presents due to concern for scrotal abscess.  Patient states he been compliant with his antibiotics at home.  States that has been have increasing swelling and pain along his testicles and underneath.  He feels like he might be draining slightly starting today.  No fever at home but does endorse chills.  No penile discharge.  Patient states has had a history of these large abscesses in the past states that have been drained.  No chest pain or shortness of breath.  No abdominal pain.  Previous medical history reviewed : Patient was seen because of scrotal abscess on July 11.  An ultrasound that showed right scrotal wall thickening or edema without discrete fluid collection at that time.  Patient was discharged home with clindamycin  for management of scrotal cellulitis.   Upon exam, patient hemodynamically  stable.  Slightly tachycardic when he first came in.  Maps appropriate.   Patient does have what looks like to be a large abscess just underneath his scrotum as well as probable extension into his right scrotum.  Will amount appearing material being draining.  No crepitance.  No history of diabetes.   Obtain ultrasound of the testes to make sure there is no underlying torsion.  No torsion was seen.  Consistent for abscess.  CT scan obtained as well to see how deep this was tracking.  No free air.  No concerns for Fournier gangrene to this point time.  Patient has no history of diabetes.  Not rapidly expanding.  Given leukocytosis as well as  fever, did start patient on vancomycin  as well as Zosyn .  Blood cultures were obtained.  Patient received 2 L of fluid in the ED.  Consulted urology.  Dr. Shane.  Formed I&D at bedside.  I sedated the patient for stat incision and drainage.  Patient tolerated well.       Final diagnoses:  Abscess  Cellulitis, unspecified cellulitis site    ED Discharge Orders     None          Simon Lavonia SAILOR, MD 05/30/24 2244

## 2024-05-30 NOTE — Progress Notes (Signed)
 Respiratory Therapist at [facility]  in room number WA22 during procedure.  Suction with [place] at [facility] set up and ready to use. Ambu bag at [facility] and ready to use.  Patient placed on ETCO2 Nasal Cannula at 2 LPM.  Vitals at conclusion of procedure:  ETCO2 31 mmHg HR=170  RR=21 SPO2 =100%  Patient awake and able to verbalize name at the end of the procedure.

## 2024-05-30 NOTE — Procedures (Signed)
 Incision and drainage of scrotal abscess  Indication: Right-sided scrotal abscess on imaging and exam.  Procedure findings: Right scrotal abscess drained without complication no areas of necrosis or concern for necrotizing fasciitis.  Procedure in detail: After informed consent was confirmed timeout was performed confirming patient and procedure for ketamine  sedation and I&D of scrotal abscess.  Patient was then prepped and draped appropriately.  Next a 15 blade was used to make an incision on the scrotum along th R lateral side about 4-5 cm long  Purulent drainage was noted the area was washed out using saline and Betadine.  The incision was opened further to extend from 1 edge of the abscess to the other.  Using blunt dissection all the loculations were broken up.  Until the abscess washout appropriately.  Abscess was then packed with Kerlix soaked in Betadine.  Jockstrap was placed on the patient tolerated procedure well.

## 2024-05-30 NOTE — Progress Notes (Signed)
 Pharmacy Antibiotic Note  Ryan Lester is a 28 y.o. male admitted on 05/30/2024 with scrotalabscess.  Pharmacy has been consulted for vancomycin  dosing.  Plan: Vancomycin  2000 mg IV x 1 then vancomycin  1750 mg IV every 12 hours (Goal AUC 400-550, eAUC 481.7, SCr used: 0.97) Monitor clinical progress, renal function, vancomycin  levels as indicated F/U C&S, abx deescalation / LOT   Height: 6' (182.9 cm) Weight: (!) 143.2 kg (315 lb 11.2 oz) IBW/kg (Calculated) : 77.6  Temp (24hrs), Avg:99.7 F (37.6 C), Min:98.3 F (36.8 C), Max:101.9 F (38.8 C)  Recent Labs  Lab 05/27/24 1407 05/27/24 1416 05/30/24 1250  WBC 15.2*  --  15.4*  CREATININE 1.39*  --  0.97  LATICACIDVEN  --  1.5  --     Estimated Creatinine Clearance: 165 mL/min (by C-G formula based on SCr of 0.97 mg/dL).    Allergies  Allergen Reactions   Sulfa Antibiotics Itching and Lester    Antimicrobials this admission: 7/14 zosyn  >>  7/14 vancomycin  >>    Microbiology results: 7/14 BCx: ordered  Thank you for allowing pharmacy to be a part of this patient's care.  Eleanor Agent, PharmD, BCPS 05/30/2024 9:36 PM

## 2024-05-30 NOTE — ED Provider Triage Note (Signed)
 Emergency Medicine Provider Triage Evaluation Note  Ryan Lester , a 28 y.o. male  was evaluated in triage.  Pt complains of abscess.  Review of Systems  Positive: Worsening symptoms, feels unwell Negative: Fever, vomiting  Physical Exam  BP (!) 149/97 (BP Location: Right Arm)   Pulse (!) 115   Temp 98.7 F (37.1 C) (Oral)   Resp 16   SpO2 100%  Gen:   Awake, no distress   Resp:  Normal effort  MSK:   Moves extremities without difficulty  Other:    Medical Decision Making  Medically screening exam initiated at 12:37 PM.  Appropriate orders placed.  Ryan Lester was informed that the remainder of the evaluation will be completed by another provider, this initial triage assessment does not replace that evaluation, and the importance of remaining in the ED until their evaluation is complete.  Seen 2 days ago for right groin abscess, started on [place] without I&D. Here with worsening symptoms. No drainage. Feels unwell but denies fever.    Ryan Balls, PA-C 05/30/24 1238
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
## 2024-05-30 NOTE — ED Triage Notes (Addendum)
 Pt reports abscess is getting larger. Not draining. Taking prescribed abx. No fever. 800mg  ibuprofen  this morning

## 2024-05-31 DIAGNOSIS — L0390 Cellulitis, unspecified: Secondary | ICD-10-CM

## 2024-05-31 DIAGNOSIS — L0291 Cutaneous abscess, unspecified: Secondary | ICD-10-CM

## 2024-05-31 LAB — CBC
HCT: 40.3 % (ref 39.0–52.0)
Hemoglobin: 12.6 g/dL — ABNORMAL LOW (ref 13.0–17.0)
MCH: 28.6 pg (ref 26.0–34.0)
MCHC: 31.3 g/dL (ref 30.0–36.0)
MCV: 91.4 fL (ref 80.0–100.0)
Platelets: 303 K/uL (ref 150–400)
RBC: 4.41 MIL/uL (ref 4.22–5.81)
RDW: 14 % (ref 11.5–15.5)
WBC: 15.5 K/uL — ABNORMAL HIGH (ref 4.0–10.5)
nRBC: 0 % (ref 0.0–0.2)

## 2024-05-31 LAB — BASIC METABOLIC PANEL WITH GFR
Anion gap: 10 (ref 5–15)
BUN: 9 mg/dL (ref 6–20)
CO2: 20 mmol/L — ABNORMAL LOW (ref 22–32)
Calcium: 8.7 mg/dL — ABNORMAL LOW (ref 8.9–10.3)
Chloride: 104 mmol/L (ref 98–111)
Creatinine, Ser: 1.32 mg/dL — ABNORMAL HIGH (ref 0.61–1.24)
GFR, Estimated: >60 mL/min (ref >60)
Glucose, Bld: 118 mg/dL — ABNORMAL HIGH (ref 70–99)
Potassium: 3.5 mmol/L (ref 3.5–5.1)
Sodium: 134 mmol/L — ABNORMAL LOW (ref 135–145)

## 2024-05-31 LAB — LACTIC ACID, PLASMA: Lactic Acid, Venous: 0.9 mmol/L (ref 0.5–1.9)

## 2024-05-31 LAB — MRSA NEXT GEN BY PCR, NASAL: MRSA by PCR Next Gen: NOT DETECTED

## 2024-05-31 MED ORDER — CHLORHEXIDINE GLUCONATE CLOTH 2 % EX PADS
6.0000 | MEDICATED_PAD | Freq: Every day | CUTANEOUS | Status: DC
  Administered 2024-05-31: 6 via TOPICAL

## 2024-05-31 MED ORDER — NICOTINE 14 MG/24HR TD PT24
MEDICATED_PATCH | TRANSDERMAL | Status: AC
  Filled 2024-05-31: qty 1

## 2024-05-31 MED ORDER — NICOTINE 7 MG/24HR TD PT24
7.0000 mg | MEDICATED_PATCH | Freq: Once | TRANSDERMAL | Status: AC
  Administered 2024-05-31 (×2): 7 mg via TRANSDERMAL
  Filled 2024-05-31 (×2): qty 1

## 2024-05-31 MED ORDER — NICOTINE 14 MG/24HR TD PT24
14.0000 mg | MEDICATED_PATCH | Freq: Once | TRANSDERMAL | Status: DC
  Administered 2024-05-31: 14 mg via TRANSDERMAL

## 2024-05-31 MED ORDER — HYDROMORPHONE HCL 1 MG/ML IJ SOLN
1.0000 mg | Freq: Once | INTRAMUSCULAR | Status: AC
  Administered 2024-05-31: 1 mg via INTRAVENOUS

## 2024-05-31 MED ORDER — VANCOMYCIN HCL 1500 MG/300ML IV SOLN
1500.0000 mg | Freq: Two times a day (BID) | INTRAVENOUS | Status: DC
  Administered 2024-05-31 – 2024-06-01 (×2): 1500 mg via INTRAVENOUS
  Filled 2024-05-31 (×3): qty 300

## 2024-05-31 NOTE — Plan of Care (Signed)
  Problem: Education: Goal: Knowledge of General Education information will improve Description: Including pain rating scale, medication(s)/side effects and non-pharmacologic comfort measures Outcome: Progressing   Problem: Clinical Measurements: Goal: Diagnostic test results will improve Outcome: Progressing   Problem: Clinical Measurements: Goal: Cardiovascular complication will be avoided Outcome: Progressing   

## 2024-05-31 NOTE — Plan of Care (Signed)

## 2024-05-31 NOTE — Progress Notes (Signed)
 Pharmacy Antibiotic Note  Ryan Lester is a 28 y.o. male admitted on 05/30/2024 with scrotal abscess.  Pharmacy has been consulted for vancomycin  dosing.  Plan: Change to Vancomycin  1500 mg IV every 12 hours (SCr 1.3, Vd 0.5, est AUC 525) Measure Vanc levels as needed.  Goal AUC = 400 - 550    Height: 6' (182.9 cm) Weight: (!) 143.4 kg (316 lb 2.2 oz) IBW/kg (Calculated) : 77.6  Temp (24hrs), Avg:99.3 F (37.4 C), Min:98.3 F (36.8 C), Max:101.9 F (38.8 C)  Recent Labs  Lab 05/27/24 1407 05/27/24 1416 05/30/24 1250 05/31/24 0106  WBC 15.2*  --  15.4* 15.5*  CREATININE 1.39*  --  0.97 1.32*  LATICACIDVEN  --  1.5  --  0.9    Estimated Creatinine Clearance: 121.3 mL/min (A) (by C-G formula based on SCr of 1.32 mg/dL (H)).    Allergies  Allergen Reactions   Sulfa Antibiotics Itching and Rash    Antimicrobials this admission: 7/14 zosyn  >>  7/14 vancomycin  >>    Microbiology results: 7/14 MRSA PCR: not detected 7/15 UCx: 7/15 BCx:  Thank you for allowing pharmacy to be a part of this patient's care.  Wanda Hasting PharmD, BCPS [facility] [phone] 05/31/2024 9:44 AM

## 2024-05-31 NOTE — Progress Notes (Signed)
 Subjective: Patient resting comfortably, pain is well-controlled.  Dressing is still moist.  Little postprocedural bleeding.  Objective: Vital signs in last 24 hours: Temp:  [98.3 F (36.8 C)-101.9 F (38.8 C)] 98.3 F (36.8 C) (07/15 1250) Pulse Rate:  [78-184] 89 (07/15 1250) Resp:  [7-28] 20 (07/15 1250) BP: (115-208)/(59-104) 145/89 (07/15 1250) SpO2:  [85 %-100 %] 97 % (07/15 1250) Weight:  [143.2 kg-143.4 kg] 143.4 kg (07/14 2318)  Assessment/Plan: 28 year old male seen recurrently for scrotal abscess  # Scrotal abscess Bedside I&D with Dr. Shane on 05/30/2024. Agree with broad ABX while awaiting speciation and sensitivities Exchanged dressing.  Still very difficult for patient but tolerated better than previous admissions.  His wife who handles his regular wound care observed. Leukocytosis-15.5.  Normothermic.  No signs of systemic infection. Still needs to follow-up with dermatology but has not so for been cost prohibitive d/t lack of insurance. Urology will continue to follow. Intake/Output from previous day: 07/14 0701 - 07/15 0700 In: 1156.8 [I.V.:754.2; IV Piggyback:402.6] Out: 1500 [Urine:1500]  Intake/Output this shift: Total I/O In: 1053.1 [I.V.:722.9; IV Piggyback:330.1] Out: -   Physical Exam:  General: Alert and oriented CV: No cyanosis Lungs: equal chest rise Abdomen: Soft, NTND, no rebound or guarding Gu: Abscess of right scrotum at thigh fold.  Packed with damp Kerlix.  Lab Results: Recent Labs    05/30/24 1250 05/31/24 0106  HGB 14.9 12.6*  HCT 46.0 40.3   BMET Recent Labs    05/30/24 1250 05/31/24 0106  NA 137 134*  K 4.1 3.5  CL 107 104  CO2 21* 20*  GLUCOSE 101* 118*  BUN 9 9  CREATININE 0.97 1.32*  CALCIUM 9.4 8.7*  HGB 14.9 12.6*  WBC 15.4* 15.5*     Studies/Results: CT ABDOMEN PELVIS W CONTRAST Result Date: 05/30/2024 CLINICAL DATA:  concern for right scrotal abscess that extends into the perineum as well.  Eval size EXAM: CT ABDOMEN AND PELVIS WITH CONTRAST TECHNIQUE: Multidetector CT imaging of the abdomen and pelvis was performed using the standard protocol following bolus administration of intravenous contrast. RADIATION DOSE REDUCTION: This exam was performed according to the departmental dose-optimization program which includes automated exposure control, adjustment of the mA and/or kV according to patient size and/or use of iterative reconstruction technique. CONTRAST:  OMNIPAQUE  IOHEXOL  300 MG/ML  SOLN COMPARISON:  Ultrasound scrotum 05/30/2024 FINDINGS: Lower chest: No acute abnormality. Hepatobiliary: No focal liver abnormality. No gallstones, gallbladder wall thickening, or pericholecystic fluid. No biliary dilatation. Pancreas: No focal lesion. Normal pancreatic contour. No surrounding inflammatory changes. No main pancreatic ductal dilatation. Spleen: Normal in size without focal abnormality. Adrenals/Urinary Tract: No adrenal nodule bilaterally. Bilateral kidneys enhance symmetrically. No hydronephrosis. No hydroureter. The urinary bladder is unremarkable. Stomach/Bowel: Stomach is within normal limits. No evidence of bowel wall thickening or dilatation. Stool throughout the colon. Appendix appears normal. Vascular/Lymphatic: No abdominal aorta or iliac aneurysm. No abdominal, pelvic, or inguinal lymphadenopathy. Reproductive: Prostate is unremarkable. Diffuse scrotal subcutaneus soft tissue edema. No subcutaneus soft tissue emphysema. Developing peripherally enhancing 1.8 x 1.9 x 2.9 cm poorly visualized fluid collection along the right scrotal wall. Other: No intraperitoneal free fluid. No intraperitoneal free gas. No organized fluid collection. Musculoskeletal: No abdominal wall hernia or abnormality. No suspicious lytic or blastic osseous lesions. No acute displaced fracture. IMPRESSION: 1. Diffuse bilateral scrotal edema with developing 2 x 2 x 3 cm right scrotal abscess. No subcutaneus soft  tissue emphysema. Please note necrotizing fasciitis cannot be excluded  as this is a clinical diagnosis. 2. Stool throughout the colon-correlate for constipation. Electronically Signed   By: Morgane  Naveau M.D.   On: 05/30/2024 20:23   US  SCROTUM W/DOPPLER Result Date: 05/30/2024 CLINICAL DATA:  86310 Swelling 13689 EXAM: SCROTAL ULTRASOUND DOPPLER ULTRASOUND OF THE TESTICLES TECHNIQUE: Complete ultrasound examination of the testicles, epididymis, and other scrotal structures was performed. Color and spectral Doppler ultrasound were also utilized to evaluate blood flow to the testicles. COMPARISON:  May 27, 2024, Mar 24, 2024, January 13, 2024 FINDINGS: Right testicle Measurements: 4.4 x 2.5 x 3 cm. No mass or microlithiasis visualized. Left testicle Measurements: 4.8 x 2.4 x 2.4 cm. No mass or microlithiasis visualized. Right epididymis:  Normal in size and appearance. Left epididymis:  Normal in size and appearance. Hydrocele:  Trace bilateral hydroceles. Varicocele:  None visualized. Pulsed Doppler interrogation of both testes demonstrates normal low resistance arterial and venous waveforms bilaterally. Diffuse thickening of the scrotal wall with heterogeneous collection in the lateral right scrotal wall, measuring 3 x 1.8 x 2.5 cm. Hyperemia again noted. Multiple hypoechoic structures in the inguinal regions bilaterally, measuring up to 1.6 cm in short axis dimension on the right and 1.4 cm on the left, possibly enlarged lymph nodes. IMPRESSION: 1. No acute sonographic abnormality within the testicles; more specifically, no testicular mass, findings of epididymo-orchitis, or changes of testicular torsion, at this time. 2. Diffuse scrotal wall thickening. Heterogeneous collection in the lateral right scrotal wall, measuring 3 x 1.5 x 1.8 cm with hyperemia. This is worrisome for recurrent cellulitis with worsening phlegmon or developing abscess. Electronically Signed   By: Rogelia Myers M.D.   On: 05/30/2024  19:24      LOS: 1 day   Ole Bourdon, NP [facility] Pager: [phone]  05/31/2024, 2:16 PM

## 2024-05-31 NOTE — Hospital Course (Signed)
 28 year old man PMH morbid obesity, recurrent scrotal abscesses, presented for recurrent scrotal abscess.  Seen by urology and underwent bedside incision and drainage.  Consultants Urology   Procedures/Events 7/14 Right scrotal abscess drained without complication no areas of necrosis or concern for necrotizing fasciitis.

## 2024-05-31 NOTE — Progress Notes (Signed)
  Progress Note   Patient: Ryan Lester FMW:[id number] DOB: 05-11-1996 DOA: 05/30/2024     1 DOS: the patient was seen and examined on 05/31/2024   Brief hospital course: 28 y.o. male with medical history significant for BMI 43 and recurrent scrotal abscesses who presents with worsening and a recurrent scrotal abscess.   Patient developed pain and swelling at the site of his prior scrotal abscesses a few days ago and was seen in the ED on 05/27/2024.  He was discharged home with clindamycin  at that time but has had worsening in his condition despite that.  Pain has become severe.  He has not noticed any fever or chills.   ED Course: Upon arrival to the ED, patient is found to be febrile to 38.8 C and saturating well on room air with elevated heart rate and elevated blood pressure.  Labs are most notable for normal creatinine, glucose 101, and WBC 15,400.  CT demonstrates diffuse bilateral scrotal edema with right scrotal abscess.   Urology was consulted by the ED PA, performed I&D, and recommended medical admission with cultures, broad-spectrum antibiotics, and IV fluids.    Assessment and Plan: 1. Sepsis d/t recurrent scrotal abscess  - S/p I&D by urology  - Blood cx thus far neg, pending -Pt is continued on vanc and zosyn . Urology recs to cont broad spec abx while awaiting cultures -Discussed with Urology. If final cultures are neg, consider narrowing to doxy plus augmentin  on d/c   2. Hypertensive urgency  - BP severely elevated in setting of pain; no target-organ damage  - Continue pain-control, use hydralazine  if needed  -BP now better controlled  3. ARF -Cr up to 1.32 -Cont IVF -Repeat bmet in AM      Subjective: Feels better today  Physical Exam: Vitals:   05/31/24 0500 05/31/24 0600 05/31/24 0800 05/31/24 1250  BP: 121/66 119/63  (!) 145/89  Pulse: 78 86  89  Resp: 19 20  20   Temp:   98.6 F (37 C) 98.3 F (36.8 C)  TempSrc:   Oral Oral  SpO2: 93% 100%  97%   Weight:      Height:       General exam: Awake, laying in bed, in nad Respiratory system: Normal respiratory effort, no wheezing Cardiovascular system: regular rate, s1, s2 Gastrointestinal system: Soft, nondistended, positive BS Central nervous system: CN2-12 grossly intact, strength intact Extremities: Perfused, no clubbing Skin: Normal skin turgor, no notable skin lesions seen, scrotal dressings in place Psychiatry: Mood normal // no visual hallucinations   Data Reviewed:  Labs reviewed: Na 134, K K3.5, Cr 1.32, WBC 15.5, Hgb 12.6, Plts 303   Family Communication: Pt in room, family not at bedside  Disposition: Status is: Inpatient Remains inpatient appropriate because: severity of illness  Planned Discharge Destination: Home     Author: Garnette Pelt, MD 05/31/2024 4:50 PM  For on call review [email or web address].

## 2024-06-01 LAB — COMPREHENSIVE METABOLIC PANEL WITH GFR
ALT: 21 U/L (ref 0–44)
AST: 19 U/L (ref 15–41)
Albumin: 3.3 g/dL — ABNORMAL LOW (ref 3.5–5.0)
Alkaline Phosphatase: 50 U/L (ref 38–126)
Anion gap: 12 (ref 5–15)
BUN: 9 mg/dL (ref 6–20)
CO2: 20 mmol/L — ABNORMAL LOW (ref 22–32)
Calcium: 8.7 mg/dL — ABNORMAL LOW (ref 8.9–10.3)
Chloride: 104 mmol/L (ref 98–111)
Creatinine, Ser: 0.92 mg/dL (ref 0.61–1.24)
GFR, Estimated: >60 mL/min (ref >60)
Glucose, Bld: 116 mg/dL — ABNORMAL HIGH (ref 70–99)
Potassium: 3.5 mmol/L (ref 3.5–5.1)
Sodium: 136 mmol/L (ref 135–145)
Total Bilirubin: 0.9 mg/dL (ref 0.0–1.2)
Total Protein: 7.3 g/dL (ref 6.5–8.1)

## 2024-06-01 LAB — CBC
HCT: 39.5 % (ref 39.0–52.0)
Hemoglobin: 12.4 g/dL — ABNORMAL LOW (ref 13.0–17.0)
MCH: 29 pg (ref 26.0–34.0)
MCHC: 31.4 g/dL (ref 30.0–36.0)
MCV: 92.5 fL (ref 80.0–100.0)
Platelets: 330 K/uL (ref 150–400)
RBC: 4.27 MIL/uL (ref 4.22–5.81)
RDW: 13.7 % (ref 11.5–15.5)
WBC: 11.3 K/uL — ABNORMAL HIGH (ref 4.0–10.5)
nRBC: 0 % (ref 0.0–0.2)

## 2024-06-01 LAB — URINE CULTURE: Culture: NO GROWTH

## 2024-06-01 MED ORDER — VANCOMYCIN HCL 1750 MG/350ML IV SOLN
1750.0000 mg | Freq: Two times a day (BID) | INTRAVENOUS | Status: DC
  Filled 2024-06-01: qty 350

## 2024-06-01 MED ORDER — AMOXICILLIN-POT CLAVULANATE 875-125 MG PO TABS: 1.0000 | ORAL_TABLET | Freq: Two times a day (BID) | ORAL | 0 refills | Status: AC

## 2024-06-01 MED ORDER — OXYCODONE HCL 5 MG PO TABS: 5.0000 mg | ORAL_TABLET | Freq: Four times a day (QID) | ORAL | 0 refills | Status: AC | PRN

## 2024-06-01 MED ORDER — DOXYCYCLINE HYCLATE 50 MG PO CAPS: 50.0000 mg | ORAL_CAPSULE | Freq: Two times a day (BID) | ORAL | 0 refills | Status: AC

## 2024-06-01 NOTE — Plan of Care (Signed)
  Problem: Education: Goal: Knowledge of General Education information will improve Description: Including pain rating scale, medication(s)/side effects and non-pharmacologic comfort measures Outcome: Completed/Met   Problem: Health Behavior/Discharge Planning: Goal: Ability to manage health-related needs will improve Outcome: Completed/Met   Problem: Clinical Measurements: Goal: Ability to maintain clinical measurements within normal limits will improve Outcome: Completed/Met Goal: Will remain free from infection Outcome: Completed/Met Goal: Diagnostic test results will improve Outcome: Completed/Met Goal: Respiratory complications will improve Outcome: Completed/Met Goal: Cardiovascular complication will be avoided Outcome: Completed/Met   Problem: Activity: Goal: Risk for activity intolerance will decrease Outcome: Completed/Met   Problem: Nutrition: Goal: Adequate nutrition will be maintained Outcome: Completed/Met   Problem: Coping: Goal: Level of anxiety will decrease Outcome: Completed/Met   Problem: Elimination: Goal: Will not experience complications related to bowel motility Outcome: Completed/Met Goal: Will not experience complications related to urinary retention Outcome: Completed/Met   Problem: Pain Managment: Goal: General experience of comfort will improve and/or be controlled Outcome: Completed/Met   Problem: Safety: Goal: Ability to remain free from injury will improve Outcome: Completed/Met   Problem: Skin Integrity: Goal: Risk for impaired skin integrity will decrease Outcome: Completed/Met   Problem: Fluid Volume: Goal: Hemodynamic stability will improve Outcome: Completed/Met   Problem: Clinical Measurements: Goal: Diagnostic test results will improve Outcome: Completed/Met Goal: Signs and symptoms of infection will decrease Outcome: Completed/Met   Problem: Respiratory: Goal: Ability to maintain adequate ventilation will  improve Outcome: Completed/Met

## 2024-06-01 NOTE — Progress Notes (Addendum)
 Subjective: Patient resting comfortably, pain is well-controlled.  Dressing is still moist.  Little postprocedural bleeding.  Objective: Vital signs in last 24 hours: Temp:  [98.3 F (36.8 C)-98.7 F (37.1 C)] 98.7 F (37.1 C) (07/16 1114) Pulse Rate:  [80-95] 95 (07/16 1114) Resp:  [18-20] 18 (07/16 0601) BP: (135-145)/(83-89) 140/84 (07/16 1114) SpO2:  [95 %-97 %] 97 % (07/16 1114)  Assessment/Plan: 28 year old male seen recurrently for scrotal abscess  # Scrotal abscess Bedside I&D with Dr. Shane on 05/30/2024.  Agree with broad ABX while awaiting speciation and sensitivities  Took dressing down with nursing.  All tissue is pink and viable without dishwater or purulent drainage.  Left packing out as patient was about to shower.  Nursing to replace afterward.   Leukocytosis continues to improve.  Still needs to follow-up with dermatology but has not so for been cost prohibitive d/t lack of insurance.  Okay to discharge from urologic perspective.  Please provide patient with sterile water , Curlex, scissors, and ABD pads to get through the following week. Pack with damp (use sterile saline) Kerlix every 24 hrs or when soiled. Follow up in one week with Dr. Samuella  Intake/Output from previous day: 07/15 0701 - 07/16 0700 In: 1053.1 [I.V.:722.9; IV Piggyback:330.1] Out: -   Intake/Output this shift: No intake/output data recorded.  Physical Exam:  General: Alert and oriented CV: No cyanosis Lungs: equal chest rise Abdomen: Soft, NTND, no rebound or guarding Gu: Abscess of right scrotum at thigh fold.  Packed with damp Kerlix.  Lab Results: Recent Labs    05/30/24 1250 05/31/24 0106 06/01/24 0440  HGB 14.9 12.6* 12.4*  HCT 46.0 40.3 39.5   BMET Recent Labs    05/31/24 0106 06/01/24 0440  NA 134* 136  K 3.5 3.5  CL 104 104  CO2 20* 20*  GLUCOSE 118* 116*  BUN 9 9  CREATININE 1.32* 0.92  CALCIUM 8.7* 8.7*  HGB 12.6* 12.4*  WBC 15.5* 11.3*      Studies/Results: CT ABDOMEN PELVIS W CONTRAST Result Date: 05/30/2024 CLINICAL DATA:  concern for right scrotal abscess that extends into the perineum as well. Eval size EXAM: CT ABDOMEN AND PELVIS WITH CONTRAST TECHNIQUE: Multidetector CT imaging of the abdomen and pelvis was performed using the standard protocol following bolus administration of intravenous contrast. RADIATION DOSE REDUCTION: This exam was performed according to the departmental dose-optimization program which includes automated exposure control, adjustment of the mA and/or kV according to patient size and/or use of iterative reconstruction technique. CONTRAST:  OMNIPAQUE  IOHEXOL  300 MG/ML  SOLN COMPARISON:  Ultrasound scrotum 05/30/2024 FINDINGS: Lower chest: No acute abnormality. Hepatobiliary: No focal liver abnormality. No gallstones, gallbladder wall thickening, or pericholecystic fluid. No biliary dilatation. Pancreas: No focal lesion. Normal pancreatic contour. No surrounding inflammatory changes. No main pancreatic ductal dilatation. Spleen: Normal in size without focal abnormality. Adrenals/Urinary Tract: No adrenal nodule bilaterally. Bilateral kidneys enhance symmetrically. No hydronephrosis. No hydroureter. The urinary bladder is unremarkable. Stomach/Bowel: Stomach is within normal limits. No evidence of bowel wall thickening or dilatation. Stool throughout the colon. Appendix appears normal. Vascular/Lymphatic: No abdominal aorta or iliac aneurysm. No abdominal, pelvic, or inguinal lymphadenopathy. Reproductive: Prostate is unremarkable. Diffuse scrotal subcutaneus soft tissue edema. No subcutaneus soft tissue emphysema. Developing peripherally enhancing 1.8 x 1.9 x 2.9 cm poorly visualized fluid collection along the right scrotal wall. Other: No intraperitoneal free fluid. No intraperitoneal free gas. No organized fluid collection. Musculoskeletal: No abdominal wall hernia or abnormality. No suspicious  lytic or  blastic osseous lesions. No acute displaced fracture. IMPRESSION: 1. Diffuse bilateral scrotal edema with developing 2 x 2 x 3 cm right scrotal abscess. No subcutaneus soft tissue emphysema. Please note necrotizing fasciitis cannot be excluded as this is a clinical diagnosis. 2. Stool throughout the colon-correlate for constipation. Electronically Signed   By: Morgane  Naveau M.D.   On: 05/30/2024 20:23   US  SCROTUM W/DOPPLER Result Date: 05/30/2024 CLINICAL DATA:  86310 Swelling 13689 EXAM: SCROTAL ULTRASOUND DOPPLER ULTRASOUND OF THE TESTICLES TECHNIQUE: Complete ultrasound examination of the testicles, epididymis, and other scrotal structures was performed. Color and spectral Doppler ultrasound were also utilized to evaluate blood flow to the testicles. COMPARISON:  May 27, 2024, Mar 24, 2024, January 13, 2024 FINDINGS: Right testicle Measurements: 4.4 x 2.5 x 3 cm. No mass or microlithiasis visualized. Left testicle Measurements: 4.8 x 2.4 x 2.4 cm. No mass or microlithiasis visualized. Right epididymis:  Normal in size and appearance. Left epididymis:  Normal in size and appearance. Hydrocele:  Trace bilateral hydroceles. Varicocele:  None visualized. Pulsed Doppler interrogation of both testes demonstrates normal low resistance arterial and venous waveforms bilaterally. Diffuse thickening of the scrotal wall with heterogeneous collection in the lateral right scrotal wall, measuring 3 x 1.8 x 2.5 cm. Hyperemia again noted. Multiple hypoechoic structures in the inguinal regions bilaterally, measuring up to 1.6 cm in short axis dimension on the right and 1.4 cm on the left, possibly enlarged lymph nodes. IMPRESSION: 1. No acute sonographic abnormality within the testicles; more specifically, no testicular mass, findings of epididymo-orchitis, or changes of testicular torsion, at this time. 2. Diffuse scrotal wall thickening. Heterogeneous collection in the lateral right scrotal wall, measuring 3 x 1.5 x 1.8  cm with hyperemia. This is worrisome for recurrent cellulitis with worsening phlegmon or developing abscess. Electronically Signed   By: Rogelia Myers M.D.   On: 05/30/2024 19:24      LOS: 2 days   Ole Bourdon, NP [facility] Pager: [phone]  06/01/2024, 11:30 AM

## 2024-06-01 NOTE — TOC Initial Note (Signed)
 Transition of Care [facility]) - Initial/Assessment Note    Patient Details  Name: Ryan Lester MRN: [id number] Date of Birth: [birth year]  [facility] CM/SW Contact:    Doneta Glenys DASEN, RN Phone Number: 06/01/2024, 11:21 AM  Clinical Narrative:                 CM spoke with patient in the room. PTA states lives in a house with spouse Bobetta [phone]; Patient agreeable to allow CM to schedule a PCP appointment,([facility] at [facility] with Sula Leavy Rode on 07/04/2024 @ 8:00 AM) which is on the AVS; Patient states he is not eligible for medicaid and will have insurance when his job has open enrollment; Denies DME,HH,oxygen, or SDOH needs; Patients wife will transport home at discharge. No additional care management needs identified. If needs present, please place consult.  Expected Discharge Plan: Home/Self Care Barriers to Discharge: Inadequate or no insurance, Other (must enter comment) (PCP)   Patient Goals and CMS Choice Patient states their goals for this hospitalization and ongoing recovery are:: Home with spouse CMS Medicare.gov Compare Post Acute Care list provided to::  (NA)   [facility] [employer] ownership interest in [facility]provided to:: Parent NA    Expected Discharge Plan and Services In-house Referral: NA Discharge Planning Services: NA Post Acute Care Choice: NA Living arrangements for the past 2 months: Single Family Home                 DME Arranged: N/A DME Agency: NA       HH Arranged: NA HH Agency: NA        Prior Living Arrangements/Services Living arrangements for the past 2 months: Single Family Home Lives with:: Spouse Patient language and need for interpreter reviewed:: Yes Do you feel safe going back to the place where you live?: Yes      Need for Family Participation in Patient Care: No (Comment) Care giver support system in place?: Yes (comment) Current home services:  (NA) Criminal Activity/Legal Involvement  Pertinent to Current Situation/Hospitalization: No - Comment as needed  Activities of Daily Living   ADL Screening (condition at time of admission) Independently performs ADLs?: Yes (appropriate for developmental age) Is the patient deaf or have difficulty hearing?: No Does the patient have difficulty seeing, even when wearing glasses/contacts?: No Does the patient have difficulty concentrating, remembering, or making decisions?: No  Permission Sought/Granted Permission sought to share information with : Case Manager Permission granted to share information with : Yes, Verbal Permission Granted  Share Information with NAME: Khaleed, Holan (Spouse)  [phone] (Mobile)  Permission granted to share info w AGENCY: [facility]        Emotional Assessment Appearance:: Appears stated age Attitude/Demeanor/Rapport: Engaged Affect (typically observed): Appropriate Orientation: : Oriented to Self, Oriented to Place, Oriented to  Time, Oriented to Situation Alcohol / Substance Use: Not Applicable Psych Involvement: No (comment)  Admission diagnosis:  Abscess [L02.91] Scrotal abscess [N49.2] Cellulitis, unspecified cellulitis site [L03.90] Patient Active Problem List   Diagnosis Date Noted   Hypertensive urgency 05/30/2024   Scrotal wall abscess 01/14/2024   SIRS (systemic inflammatory response syndrome) (HCC) 12/20/2023   Sepsis (HCC) 12/20/2023   Tobacco abuse 12/20/2023   Scrotal abscess 02/06/2022   Memory deficit 09/12/2013   PCP:  Pcp, No Pharmacy:   [id number] [address], [place] - [address] AT [facility] OF [address] [place] [address] Phone: [phone] Fax: [phone]  [facility] [address] [place] [address] Phone: [phone] Fax: [phone]  [facility] - [facility] [address] [place] [address] Phone: [phone] Fax:  [phone]     Social Drivers of Health (SDOH) Social History: SDOH Screenings   Food Insecurity: No Food Insecurity (05/31/2024)  Housing: Low Risk  (05/31/2024)  Transportation Needs: No Transportation Needs (05/31/2024)  Utilities: Not At Risk (05/31/2024)  Social Connections: Socially Integrated (01/14/2024)  Tobacco Use: High Risk (05/30/2024)   SDOH Interventions:     Readmission Risk Interventions    06/01/2024   11:16 AM  Readmission Risk Prevention Plan  Transportation Screening Complete  PCP or Specialist Appt within 5-7 Days Complete  Home Care Screening Complete  Medication Review (RN CM) Complete

## 2024-06-01 NOTE — Plan of Care (Signed)

## 2024-06-01 NOTE — Progress Notes (Signed)
 Pharmacy Antibiotic Note  Ryan Lester is a 28 y.o. male with hx scrotal abscesses who presented to the ED on 05/30/2024 with worsening of scrotum pain.   Abd/pelvis CT showed right scrotal abscess. He underwent I&D on 05/30/24.  He is currently on vancomycin  and zosyn  for infection.  Today, 06/01/2024: - day #2 abx - afeb, wbc down 11.3 - scr down 0.92 (crcl>100) - all cultures have been negative thus far  Plan: - adjust vancomycin  to 1750 mg IV q12h for est. AUC 436 - zosyn  3.375 gm IV q8h (infuse over 4 hrs)  ____________________________________________  Height: 6' (182.9 cm) Weight: (!) 143.4 kg (316 lb 2.2 oz) IBW/kg (Calculated) : 77.6  Temp (24hrs), Avg:98.7 F (37.1 C), Min:98.6 F (37 C), Max:98.7 F (37.1 C)  Recent Labs  Lab 05/27/24 1407 05/27/24 1416 05/30/24 1250 05/31/24 0106 06/01/24 0440  WBC 15.2*  --  15.4* 15.5* 11.3*  CREATININE 1.39*  --  0.97 1.32* 0.92  LATICACIDVEN  --  1.5  --  0.9  --     Estimated Creatinine Clearance: 174.1 mL/min (by C-G formula based on SCr of 0.92 mg/dL).    Allergies  Allergen Reactions   Sulfa Antibiotics Itching and Rash    Thank you for allowing pharmacy to be a part of this patient's care.  Ryan Lester 06/01/2024 12:51 PM

## 2024-06-01 NOTE — Discharge Summary (Signed)
 Physician Discharge Summary   Patient: Ryan Lester MRN: [id number] DOB: 06-28-1996  Admit date:     05/30/2024  Discharge date: 06/01/24  Discharge Physician: Toribio Door   PCP: Pcp, No   Recommendations at discharge:   Follow-up recurrent scrotal abscess  Discharge Diagnoses: Principal Problem:   Scrotal abscess Active Problems:   Hypertensive urgency  Resolved Problems:   * No resolved hospital problems. *  Hospital Course: 28 year old man PMH morbid obesity, recurrent scrotal abscesses, presented for recurrent scrotal abscess.  Seen by urology and underwent bedside incision and drainage.  Consultants Urology   Procedures/Events 7/14 Right scrotal abscess drained without complication no areas of necrosis or concern for necrotizing fasciitis.   Sepsis secondary to recurrent scrotal abscess  Treated with antibiotics and seen by urology, underwent bedside incision and drainage.  Blood cultures no growth thus far.  No wound culture sent.  Previous cultures positive for actinomyces. Doing well, cleared for discharge by urology.  Will discharge on empiric antibiotics doxycycline  and Augmentin .  Follow-up with urology in 1 week dressing changes as instructed by urology.   Hypertensive urgency  Resolved.  Secondary to pain   Acute kidney injury Creatinine up to 1.3 to at peak, baseline around 1 Resolved.  Morbid obesity Body mass index is 42.88 kg/m.       Pain control - [place]  Controlled Substance Reporting System database was reviewed.   Disposition: Home Diet recommendation:  Regular diet DISCHARGE MEDICATION: Allergies as of 06/01/2024       Reactions   Sulfa Antibiotics Itching, Rash        Medication List     STOP taking these medications    clindamycin  150 MG capsule Commonly known as: CLEOCIN        TAKE these medications    amoxicillin -clavulanate 875-125 MG tablet Commonly known as: AUGMENTIN  Take 1 tablet by mouth 2  (two) times daily.   doxycycline  50 MG capsule Commonly known as: VIBRAMYCIN  Take 1 capsule (50 mg total) by mouth 2 (two) times daily.   oxyCODONE  5 MG immediate release tablet Commonly known as: Oxy IR/ROXICODONE  Take 1 tablet (5 mg total) by mouth every 6 (six) hours as needed for moderate pain (pain score 4-6) or severe pain (pain score 7-10).               Discharge Care Instructions  (From admission, onward)           Start     Ordered   06/01/24 0000  Discharge wound care:       Comments: Pack with damp (use sterile saline) Kerlix every 24 hrs or when soiled.   06/01/24 1324            Follow-up Information     Shane Steffan BROCKS, MD. Schedule an appointment as soon as possible for a visit.   Specialty: Urology Why: Office will contact you with an appointment, call if they do not call you by Friday Contact information: [address] [place] [place] [address] [phone]                Feels better Ready to go home  Discharge Exam: Filed Weights   05/30/24 2113 05/30/24 2318  Weight: (!) 143.2 kg (!) 143.4 kg   Physical Exam Vitals reviewed.  Constitutional:      General: He is not in acute distress.    Appearance: He is not ill-appearing or toxic-appearing.  Cardiovascular:     Rate and Rhythm:  Normal rate and regular rhythm.     Heart sounds: No murmur heard. Pulmonary:     Effort: Pulmonary effort is normal. No respiratory distress.     Breath sounds: No wheezing, rhonchi or rales.  Neurological:     Mental Status: He is alert.  Psychiatric:        Mood and Affect: Mood normal.        Behavior: Behavior normal.      Condition at discharge: good  The results of significant diagnostics from this hospitalization (including imaging, microbiology, ancillary and laboratory) are listed below for reference.   Imaging Studies: CT ABDOMEN PELVIS W CONTRAST Result Date: 05/30/2024 CLINICAL DATA:  concern for right scrotal  abscess that extends into the perineum as well. Eval size EXAM: CT ABDOMEN AND PELVIS WITH CONTRAST TECHNIQUE: Multidetector CT imaging of the abdomen and pelvis was performed using the standard protocol following bolus administration of intravenous contrast. RADIATION DOSE REDUCTION: This exam was performed according to the departmental dose-optimization program which includes automated exposure control, adjustment of the mA and/or kV according to patient size and/or use of iterative reconstruction technique. CONTRAST:  OMNIPAQUE  IOHEXOL  300 MG/ML  SOLN COMPARISON:  Ultrasound scrotum 05/30/2024 FINDINGS: Lower chest: No acute abnormality. Hepatobiliary: No focal liver abnormality. No gallstones, gallbladder wall thickening, or pericholecystic fluid. No biliary dilatation. Pancreas: No focal lesion. Normal pancreatic contour. No surrounding inflammatory changes. No main pancreatic ductal dilatation. Spleen: Normal in size without focal abnormality. Adrenals/Urinary Tract: No adrenal nodule bilaterally. Bilateral kidneys enhance symmetrically. No hydronephrosis. No hydroureter. The urinary bladder is unremarkable. Stomach/Bowel: Stomach is within normal limits. No evidence of bowel wall thickening or dilatation. Stool throughout the colon. Appendix appears normal. Vascular/Lymphatic: No abdominal aorta or iliac aneurysm. No abdominal, pelvic, or inguinal lymphadenopathy. Reproductive: Prostate is unremarkable. Diffuse scrotal subcutaneus soft tissue edema. No subcutaneus soft tissue emphysema. Developing peripherally enhancing 1.8 x 1.9 x 2.9 cm poorly visualized fluid collection along the right scrotal wall. Other: No intraperitoneal free fluid. No intraperitoneal free gas. No organized fluid collection. Musculoskeletal: No abdominal wall hernia or abnormality. No suspicious lytic or blastic osseous lesions. No acute displaced fracture. IMPRESSION: 1. Diffuse bilateral scrotal edema with developing 2 x 2 x 3  cm right scrotal abscess. No subcutaneus soft tissue emphysema. Please note necrotizing fasciitis cannot be excluded as this is a clinical diagnosis. 2. Stool throughout the colon-correlate for constipation. Electronically Signed   By: Morgane  Naveau M.D.   On: 05/30/2024 20:23   US  SCROTUM W/DOPPLER Result Date: 05/30/2024 CLINICAL DATA:  86310 Swelling 13689 EXAM: SCROTAL ULTRASOUND DOPPLER ULTRASOUND OF THE TESTICLES TECHNIQUE: Complete ultrasound examination of the testicles, epididymis, and other scrotal structures was performed. Color and spectral Doppler ultrasound were also utilized to evaluate blood flow to the testicles. COMPARISON:  May 27, 2024, Mar 24, 2024, January 13, 2024 FINDINGS: Right testicle Measurements: 4.4 x 2.5 x 3 cm. No mass or microlithiasis visualized. Left testicle Measurements: 4.8 x 2.4 x 2.4 cm. No mass or microlithiasis visualized. Right epididymis:  Normal in size and appearance. Left epididymis:  Normal in size and appearance. Hydrocele:  Trace bilateral hydroceles. Varicocele:  None visualized. Pulsed Doppler interrogation of both testes demonstrates normal low resistance arterial and venous waveforms bilaterally. Diffuse thickening of the scrotal wall with heterogeneous collection in the lateral right scrotal wall, measuring 3 x 1.8 x 2.5 cm. Hyperemia again noted. Multiple hypoechoic structures in the inguinal regions bilaterally, measuring up to 1.6 cm in short axis dimension  on the right and 1.4 cm on the left, possibly enlarged lymph nodes. IMPRESSION: 1. No acute sonographic abnormality within the testicles; more specifically, no testicular mass, findings of epididymo-orchitis, or changes of testicular torsion, at this time. 2. Diffuse scrotal wall thickening. Heterogeneous collection in the lateral right scrotal wall, measuring 3 x 1.5 x 1.8 cm with hyperemia. This is worrisome for recurrent cellulitis with worsening phlegmon or developing abscess. Electronically  Signed   By: Rogelia Myers M.D.   On: 05/30/2024 19:24   US  SCROTUM W/DOPPLER Result Date: 05/27/2024 CLINICAL DATA:  Scrotal abscess. EXAM: SCROTAL ULTRASOUND DOPPLER ULTRASOUND OF THE TESTICLES TECHNIQUE: Complete ultrasound examination of the testicles, epididymis, and other scrotal structures was performed. Color and spectral Doppler ultrasound were also utilized to evaluate blood flow to the testicles. COMPARISON:  Mar 24, 2024. FINDINGS: Right testicle Measurements: 4.7 x 3.5 x 2.6 cm. No mass or microlithiasis visualized. Left testicle Measurements: 3.9 x 3.1 x 2.4 cm. No mass or microlithiasis visualized. Right epididymis:  Normal in size and appearance. Left epididymis:  Normal in size and appearance. Hydrocele:  Small right hydrocele. Varicocele:  None visualized. Pulsed Doppler interrogation of both testes demonstrates normal low resistance arterial and venous waveforms bilaterally. Right scrotal wall thickening is noted without discrete fluid collection. IMPRESSION: No evidence of testicular mass or torsion. Small right hydrocele. Right scrotal wall thickening or edema is noted without discrete fluid collection. Electronically Signed   By: Lynwood Landy Raddle M.D.   On: 05/27/2024 14:59    Microbiology: Results for orders placed or performed during the hospital encounter of 05/30/24  MRSA Next Gen by PCR, Nasal     Status: None   Collection Time: 05/30/24 11:51 PM   Specimen: Nasal Mucosa; Nasal Swab  Result Value Ref Range Status   MRSA by PCR Next Gen NOT DETECTED NOT DETECTED Final    Comment: (NOTE) The GeneXpert MRSA Assay (FDA approved for NASAL specimens only), is one component of a comprehensive MRSA colonization surveillance program. It is not intended to diagnose MRSA infection nor to guide or monitor treatment for MRSA infections. Test performance is not FDA approved in patients less than 20 years old. Performed at [facility], [address], [place], [place] [address]   Blood culture (routine x 2)     Status: None (Preliminary result)   Collection Time: 05/31/24  1:07 AM   Specimen: BLOOD  Result Value Ref Range Status   Specimen Description   Final    BLOOD BLOOD LEFT ARM Performed at [facility], [address], [place], [place] [address]    Special Requests   Final    BOTTLES DRAWN AEROBIC AND ANAEROBIC Blood Culture results may not be optimal due to an inadequate volume of blood received in culture bottles Performed at [facility], [address], [place], [place] [address]    Culture   Final    NO GROWTH 1 DAY Performed at [facility], [address], [place], [place] [address]    Report Status PENDING  Incomplete  Blood culture (routine x 2)     Status: None (Preliminary result)   Collection Time: 05/31/24  1:07 AM   Specimen: BLOOD  Result Value Ref Range Status   Specimen Description   Final    BLOOD BLOOD RIGHT HAND Performed at [facility], [address] [place], [place] [address]    Special Requests   Final  BOTTLES DRAWN AEROBIC AND ANAEROBIC Blood Culture results may not be optimal due to an inadequate volume of blood received in culture bottles Performed at [facility], [address], [place], [place] [address]    Culture   Final    NO GROWTH 1 DAY Performed at [facility], [address] [place], [place] [address]    Report Status PENDING  Incomplete  Urine Culture (for pregnant, neutropenic or urologic patients or patients with an indwelling urinary catheter)     Status: None   Collection Time: 05/31/24  2:28 AM   Specimen: Urine, Clean Catch  Result Value Ref Range Status   Specimen Description   Final    URINE, CLEAN CATCH Performed at [facility], [address], [place], [place] [address]    Special Requests   Final    NONE Performed at [facility],  [address] [place], [place] [address]    Culture   Final    NO GROWTH Performed at [facility], [address], [place], [place] [address]    Report Status 06/01/2024 FINAL  Final    Labs: CBC: Recent Labs  Lab 05/27/24 1407 05/30/24 1250 05/31/24 0106 06/01/24 0440  WBC 15.2* 15.4* 15.5* 11.3*  NEUTROABS 12.3* 12.2*  --   --   HGB 14.5 14.9 12.6* 12.4*  HCT 46.0 46.0 40.3 39.5  MCV 91.3 90.2 91.4 92.5  PLT 251 343 303 330   Basic Metabolic Panel: Recent Labs  Lab 05/27/24 1407 05/30/24 1250 05/31/24 0106 06/01/24 0440  NA 136 137 134* 136  K 3.3* 4.1 3.5 3.5  CL 103 107 104 104  CO2 23 21* 20* 20*  GLUCOSE 135* 101* 118* 116*  BUN 9 9 9 9   CREATININE 1.39* 0.97 1.32* 0.92  CALCIUM 9.3 9.4 8.7* 8.7*   Liver Function Tests: Recent Labs  Lab 05/27/24 1407 06/01/24 0440  AST 25 19  ALT 34 21  ALKPHOS 52 50  BILITOT 1.4* 0.9  PROT 8.2* 7.3  ALBUMIN 4.0 3.3*   CBG: No results for input(s): GLUCAP in the last 168 hours.  Discharge time spent: greater than 30 minutes.  Signed: Toribio Door, MD [facility] 06/01/2024

## 2024-06-05 LAB — CULTURE, BLOOD (ROUTINE X 2)
Culture: NO GROWTH
Culture: NO GROWTH

## 2024-07-04 ENCOUNTER — Ambulatory Visit: Payer: Self-pay
